# Patient Record
Sex: Female | Born: 1980 | Race: Black or African American | Hispanic: No | Marital: Single | Smoking: Never smoker
Health system: Southern US, Community
[De-identification: ages and names within clinical notes are randomized; demographics above are authoritative.]

## PROBLEM LIST (undated history)

## (undated) DIAGNOSIS — E669 Obesity, unspecified: Secondary | ICD-10-CM

## (undated) DIAGNOSIS — F32A Depression, unspecified: Secondary | ICD-10-CM

## (undated) DIAGNOSIS — R87629 Unspecified abnormal cytological findings in specimens from vagina: Secondary | ICD-10-CM

## (undated) DIAGNOSIS — F419 Anxiety disorder, unspecified: Secondary | ICD-10-CM

## (undated) HISTORY — DX: Anxiety disorder, unspecified: F41.9

## (undated) HISTORY — DX: Unspecified abnormal cytological findings in specimens from vagina: R87.629

## (undated) HISTORY — DX: Depression, unspecified: F32.A

## (undated) HISTORY — PX: NO PAST SURGERIES: SHX2092

---

## 2008-03-31 ENCOUNTER — Emergency Department (HOSPITAL_BASED_OUTPATIENT_CLINIC_OR_DEPARTMENT_OTHER): Admission: EM | Admit: 2008-03-31 | Discharge: 2008-03-31 | Payer: Self-pay | Admitting: Emergency Medicine

## 2008-06-30 ENCOUNTER — Emergency Department (HOSPITAL_BASED_OUTPATIENT_CLINIC_OR_DEPARTMENT_OTHER): Admission: EM | Admit: 2008-06-30 | Discharge: 2008-06-30 | Payer: Self-pay | Admitting: Emergency Medicine

## 2011-06-23 LAB — URINALYSIS, ROUTINE W REFLEX MICROSCOPIC
Leukocytes, UA: NEGATIVE
Nitrite: NEGATIVE
Protein, ur: NEGATIVE
Specific Gravity, Urine: 1.026
Urobilinogen, UA: 1

## 2011-06-23 LAB — URINE MICROSCOPIC-ADD ON

## 2011-06-28 LAB — DIFFERENTIAL
Eosinophils Relative: 1
Lymphocytes Relative: 19
Lymphs Abs: 1.5
Monocytes Absolute: 0.5

## 2011-06-28 LAB — BASIC METABOLIC PANEL
Chloride: 104
GFR calc non Af Amer: >60
Glucose, Bld: 101 — ABNORMAL HIGH
Potassium: 3.6
Sodium: 142

## 2011-06-28 LAB — CBC
HCT: 38.2
Hemoglobin: 12.9
WBC: 8

## 2011-06-28 LAB — PREGNANCY, URINE: Preg Test, Ur: NEGATIVE

## 2011-06-28 LAB — URINALYSIS, ROUTINE W REFLEX MICROSCOPIC
Bilirubin Urine: NEGATIVE
Glucose, UA: NEGATIVE
Nitrite: NEGATIVE
Specific Gravity, Urine: 1.016
pH: 7

## 2011-06-28 LAB — WET PREP, GENITAL

## 2016-01-22 ENCOUNTER — Emergency Department (HOSPITAL_BASED_OUTPATIENT_CLINIC_OR_DEPARTMENT_OTHER)
Admission: EM | Admit: 2016-01-22 | Discharge: 2016-01-22 | Disposition: A | Discharge status: Home / Self Care | Payer: Managed Care, Other (non HMO) | Attending: Emergency Medicine | Admitting: Emergency Medicine

## 2016-01-22 ENCOUNTER — Encounter (HOSPITAL_BASED_OUTPATIENT_CLINIC_OR_DEPARTMENT_OTHER): Payer: Self-pay | Admitting: Emergency Medicine

## 2016-01-22 DIAGNOSIS — E669 Obesity, unspecified: Secondary | ICD-10-CM | POA: Diagnosis not present

## 2016-01-22 DIAGNOSIS — J209 Acute bronchitis, unspecified: Secondary | ICD-10-CM

## 2016-01-22 DIAGNOSIS — R51 Headache: Secondary | ICD-10-CM | POA: Insufficient documentation

## 2016-01-22 DIAGNOSIS — R0981 Nasal congestion: Secondary | ICD-10-CM | POA: Insufficient documentation

## 2016-01-22 DIAGNOSIS — R05 Cough: Secondary | ICD-10-CM | POA: Diagnosis not present

## 2016-01-22 DIAGNOSIS — R531 Weakness: Secondary | ICD-10-CM | POA: Insufficient documentation

## 2016-01-22 HISTORY — DX: Obesity, unspecified: E66.9

## 2016-01-22 MED ORDER — BENZONATATE 100 MG PO CAPS
200.0000 mg | ORAL_CAPSULE | Freq: Once | ORAL | Status: AC
  Administered 2016-01-22: 200 mg via ORAL
  Filled 2016-01-22: qty 2

## 2016-01-22 MED ORDER — HYDROCOD POLST-CPM POLST ER 10-8 MG/5ML PO SUER: 5.0000 mL | Freq: Two times a day (BID) | ORAL | Status: DC | PRN

## 2016-01-22 MED ORDER — ALBUTEROL SULFATE HFA 108 (90 BASE) MCG/ACT IN AERS
2.0000 | INHALATION_SPRAY | RESPIRATORY_TRACT | Status: DC | PRN
  Administered 2016-01-22: 2 via RESPIRATORY_TRACT
  Filled 2016-01-22: qty 6.7

## 2016-01-22 NOTE — ED Notes (Signed)
MD at bedside. 

## 2016-01-22 NOTE — ED Provider Notes (Signed)
CSN: [id number]     Arrival date & time 01/22/16  0016 History   First MD Initiated Contact with Patient 01/22/16 0031     Chief Complaint  Patient presents with  . Flu-Like Symptoms      (Consider location/radiation/quality/duration/timing/severity/associated sxs/prior Treatment) HPI this is a 35 year old female with a three-week history of flulike symptoms. She initially had cold-like symptoms including nasal congestion, sinus pressure, ear pressure and throat discomfort. She has subsequently developed a nonproductive cough which is paroxysmal in nature and occurs every few minutes. She is having difficulty taking a deep breath because the stimulates coughing. She denies shortness of breath or wheezing. She is not having any fever. She states her body hurts "everywhere", and is worse with coughing. She rates her pain as a 9 out of 10. She has been taking over-the-counter cough and cold medications without adequate relief.   Past Medical History  Diagnosis Date  . Obesity    History reviewed. No pertinent past surgical history. No family history on file. Social History  Substance Use Topics  . Smoking status: Never Smoker   . Smokeless tobacco: None  . Alcohol Use: No   OB History    No data available     Review of Systems  All other systems reviewed and are negative.   Allergies  Review of patient's allergies indicates no known allergies.  Home Medications   Prior to Admission medications   Not on File   BP 116/86 mmHg  Pulse 83  Temp(Src) 98.5 F (36.9 C) (Oral)  Resp 16  Ht 5\' 5"  (1.651 m)  Wt 289 lb (131.09 kg)  BMI 48.09 kg/m2  SpO2 98%   Physical Exam  General: Well-developed, obese female in no acute distress; appearance consistent with age of record HENT: normocephalic; atraumatic; TMs normal; no tenderness to percussion over sinuses Eyes: pupils equal, round and reactive to light; extraocular muscles intact Neck: supple Heart: regular rate and  rhythm Lungs: Decreased air movement bilaterally; coughing on attempted deep breathing Abdomen: soft; obese; nontender; bowel sounds present Extremities: No deformity; full range of motion; pulses normal Neurologic: Awake, alert and oriented; motor function intact in all extremities and symmetric; no facial droop Skin: Warm and dry Psychiatric: Normal mood and affect    ED Course  Procedures (including critical care time)   MDM     Paula LibraJohn Natelie Ostrosky, MD 01/22/16 [phone]

## 2016-01-22 NOTE — ED Notes (Signed)
Cough, body aches, weakness, face pain x3 weeks.

## 2016-01-22 NOTE — Discharge Instructions (Signed)

## 2016-06-29 ENCOUNTER — Encounter (HOSPITAL_BASED_OUTPATIENT_CLINIC_OR_DEPARTMENT_OTHER): Payer: Self-pay | Admitting: *Deleted

## 2016-06-29 ENCOUNTER — Emergency Department (HOSPITAL_BASED_OUTPATIENT_CLINIC_OR_DEPARTMENT_OTHER)
Admission: EM | Admit: 2016-06-29 | Discharge: 2016-06-29 | Disposition: A | Discharge status: Home / Self Care | Payer: Managed Care, Other (non HMO) | Attending: Emergency Medicine | Admitting: Emergency Medicine

## 2016-06-29 DIAGNOSIS — Z792 Long term (current) use of antibiotics: Secondary | ICD-10-CM | POA: Insufficient documentation

## 2016-06-29 DIAGNOSIS — Z7952 Long term (current) use of systemic steroids: Secondary | ICD-10-CM | POA: Insufficient documentation

## 2016-06-29 DIAGNOSIS — R21 Rash and other nonspecific skin eruption: Secondary | ICD-10-CM | POA: Insufficient documentation

## 2016-06-29 DIAGNOSIS — L299 Pruritus, unspecified: Secondary | ICD-10-CM | POA: Diagnosis present

## 2016-06-29 MED ORDER — HYDROXYZINE HCL 25 MG PO TABS: 25.0000 mg | ORAL_TABLET | Freq: Four times a day (QID) | ORAL | 0 refills | Status: DC

## 2016-06-29 MED FILL — hydrOXYzine HCL 25 MG TABS: 25 | 5 days supply | Qty: 20 | Fill #0

## 2016-06-29 NOTE — ED Provider Notes (Signed)
[facility]   CSN: [id number] Arrival date & time: 06/29/16  4098     History   Chief Complaint Chief Complaint  Patient presents with  . Rash    HPI Destiny Lee is a 35 y.o. female.  HPI  Pt presenting with pruritic rash over trunk, back, arms.  She was seen one week ago at urgent care and given rx for steroid taper and doxycline- she states neither of these are helping her itching.  No lip or tongue involvement.  No difficulty breathing.  No fever/chills.  No new exposures.  Rash has not improved after being on steroids.  No household contacts with similar rash.  She did wash her clothes in bleach just prior to start of rash.  There are no other associated systemic symptoms, there are no other alleviating or modifying factors.   Past Medical History:  Diagnosis Date  . Obesity     There are no active problems to display for this patient.   History reviewed. No pertinent surgical history.  OB History    No data available       Home Medications    Prior to Admission medications   Medication Sig Start Date End Date Taking? Authorizing Provider  doxycycline (VIBRA-TABS) 100 MG tablet Take 100 mg by mouth 2 (two) times daily.   Yes Historical Provider, MD  predniSONE (DELTASONE) 10 MG tablet Take 20 mg by mouth daily with breakfast.    Yes Historical Provider, MD  hydrOXYzine (ATARAX/VISTARIL) 25 MG tablet Take 1 tablet (25 mg total) by mouth every 6 (six) hours. 06/29/16   Jerelyn Scott, MD    Family History No family history on file.  Social History Social History  Substance Use Topics  . Smoking status: Never Smoker  . Smokeless tobacco: Never Used  . Alcohol use No     Allergies   Review of patient's allergies indicates no known allergies.   Review of Systems Review of Systems  ROS reviewed and all otherwise negative except for mentioned in HPI   Physical Exam Updated Vital Signs BP 123/79 (BP Location: Left Arm)    Pulse 67   Temp 98.3 F (36.8 C) (Oral)   Resp 18   Ht 5\' 5"  (1.651 m)   Wt (!) 319 lb (144.7 kg)   SpO2 96%   BMI 53.08 kg/m  Vitals reviewed Physical Exam Physical Examination: General appearance - alert, well appearing, and in no distress Mental status - alert, oriented to person, place, and time Eyes - no conjunctival injection, no scleral icterus Mouth - mucous membranes moist, pharynx normal without lesions Chest - clear to auscultation, no wheezes, rales or rhonchi, symmetric air entry Heart - normal rate, regular rhythm, normal S1, S2, no murmurs, rubs, clicks or gallops Abdomen - soft, nontender, nondistended, no masses or organomegaly Neurological - alert, oriented, normal speech Extremities - peripheral pulses normal, no pedal edema, no clubbing or cyanosis Skin - normal coloration and turgor, scattered dry patches of raised flesh colored skin over back, stomach, chest, arms  ED Treatments / Results  Labs (all labs ordered are listed, but only abnormal results are displayed) Labs Reviewed - No data to display  EKG  EKG Interpretation None       Radiology No results found.  Procedures Procedures (including critical care time)  Medications Ordered in ED Medications - No data to display   Initial Impression / Assessment and Plan / ED Course  I have reviewed the triage  vital signs and the nursing notes.  Pertinent labs & imaging results that were available during my care of the patient were reviewed by me and considered in my medical decision making (see chart for details).  Clinical Course    Pt presenting with c/o rash.  She is currently on steroids and taking doxycyline without much relief.  Rash looks c/w contact dermatitis or pityriasis.  Doubt scabies.  Given rx for hydroxyzine, and referral to dermatology.    Patient is overall nontoxic and well hydrated in appearance.   No airway involvement.  Discharged with strict return precautions.  Pt agreeable  with plan.   Final Clinical Impressions(s) / ED Diagnoses   Final diagnoses:  Rash    New Prescriptions Discharge Medication List as of 06/29/2016  8:00 AM    START taking these medications   Details  hydrOXYzine (ATARAX/VISTARIL) 25 MG tablet Take 1 tablet (25 mg total) by mouth every 6 (six) hours., Starting Wed 06/29/2016, Print         Jerelyn ScottMartha Linker, MD 06/29/16 [phone]

## 2016-06-29 NOTE — Discharge Instructions (Signed)
Return to the ED with any concerns including lip or tongue swelling, difficulty breathing, vomiting and not able to keep down liquids, decreased level of alertness/lethargy, or any other alarming symptoms °

## 2016-06-29 NOTE — ED Triage Notes (Signed)
C/o upper body rash x 2 weeks with itching. [facility] and given doxycycline and prednisone without relief.

## 2016-07-11 DIAGNOSIS — R21 Rash and other nonspecific skin eruption: Secondary | ICD-10-CM | POA: Insufficient documentation

## 2016-07-15 ENCOUNTER — Ambulatory Visit: Payer: Self-pay | Admitting: Pediatrics

## 2018-11-27 ENCOUNTER — Ambulatory Visit (INDEPENDENT_AMBULATORY_CARE_PROVIDER_SITE_OTHER): Payer: 59 | Admitting: Family

## 2018-11-27 ENCOUNTER — Encounter: Payer: Self-pay | Admitting: Family

## 2018-11-27 VITALS — BP 124/67 | HR 85 | Temp 98.4°F | Resp 16

## 2018-11-27 DIAGNOSIS — R6889 Other general symptoms and signs: Secondary | ICD-10-CM | POA: Diagnosis not present

## 2018-11-27 DIAGNOSIS — H6691 Otitis media, unspecified, right ear: Secondary | ICD-10-CM

## 2018-11-27 DIAGNOSIS — T7840XA Allergy, unspecified, initial encounter: Secondary | ICD-10-CM | POA: Diagnosis not present

## 2018-11-27 MED ORDER — LORATADINE 10 MG PO TABS: 10.0000 mg | ORAL_TABLET | Freq: Every day | ORAL | 11 refills | Status: DC

## 2018-11-27 MED ORDER — AMOXICILLIN 500 MG PO CAPS: 500.0000 mg | ORAL_CAPSULE | Freq: Three times a day (TID) | ORAL | 0 refills | Status: DC

## 2018-11-27 NOTE — Progress Notes (Signed)
Subjective:    Patient ID: Destiny Lee, female    DOB: 10-18-1980, 38 y.o.   MRN: [id number]  HPI   Patient is a 38 yr old female who presents today with c/o cough, weakness. Symptoms started Sunday. Throat and ears are irritated.  Has some nasal congestion which is worse at night.   Reports + cold intolerance for some time. Reports that she keeps her apartment at 78 degrees.    Review of Systems See HPI  Past Medical History:  Diagnosis Date  . Obesity      Social History   Socioeconomic History  . Marital status: Single    Spouse name: Not on file  . Number of children: Not on file  . Years of education: Not on file  . Highest education level: Not on file  Occupational History  . Not on file  Social Needs  . Financial resource strain: Not on file  . Food insecurity:    Worry: Not on file    Inability: Not on file  . Transportation needs:    Medical: Not on file    Non-medical: Not on file  Tobacco Use  . Smoking status: Never Smoker  . Smokeless tobacco: Never Used  Substance and Sexual Activity  . Alcohol use: No  . Drug use: No  . Sexual activity: Yes    Birth control/protection: I.U.D.  Lifestyle  . Physical activity:    Days per week: Not on file    Minutes per session: Not on file  . Stress: Not on file  Relationships  . Social connections:    Talks on phone: Not on file    Gets together: Not on file    Attends religious service: Not on file    Active member of club or organization: Not on file    Attends meetings of clubs or organizations: Not on file    Relationship status: Not on file  . Intimate partner violence:    Fear of current or ex partner: Not on file    Emotionally abused: Not on file    Physically abused: Not on file    Forced sexual activity: Not on file  Other Topics Concern  . Not on file  Social History Narrative  . Not on file    No past surgical history on file.  No family history on file.  No Known  Allergies  No current outpatient medications on file prior to visit.   No current facility-administered medications on file prior to visit.     BP 124/67 (BP Location: Left Arm, Patient Position: Sitting, Cuff Size: Large)   Pulse 85   Temp 98.4 F (36.9 C) (Oral)   Resp 16   SpO2 98%       Objective:   Physical Exam Constitutional:      Appearance: She is well-developed.  HENT:     Right Ear: Ear canal normal. Tympanic membrane is erythematous and retracted. Tympanic membrane is not bulging.     Left Ear: Ear canal normal. Tympanic membrane is retracted.  Neck:     Musculoskeletal: Neck supple.     Thyroid: No thyromegaly.  Cardiovascular:     Rate and Rhythm: Normal rate and regular rhythm.     Heart sounds: Normal heart sounds. No murmur.  Pulmonary:     Effort: Pulmonary effort is normal. No respiratory distress.     Breath sounds: Normal breath sounds. No wheezing.  Skin:    General: Skin is warm and  dry.  Neurological:     Mental Status: She is alert and oriented to person, place, and time.  Psychiatric:        Behavior: Behavior normal.        Thought Content: Thought content normal.        Judgment: Judgment normal.           Assessment & Plan:  Right otitis media- new. Will rx with amoxicillin.  Cold intolerance- will check TSH and CBC.  Allergies- trial of claritin.

## 2018-11-27 NOTE — Patient Instructions (Signed)
Please begin amoxicillin for right sided ear infection. Add claritin once daily for allergy symptoms. Call if symptoms worsen or if not improved in 3 days.

## 2018-11-28 LAB — CBC WITH DIFFERENTIAL/PLATELET
Basophils Absolute: 0 10*3/uL (ref 0.0–0.1)
Basophils Relative: 0.6 % (ref 0.0–3.0)
EOS PCT: 3 % (ref 0.0–5.0)
Eosinophils Absolute: 0.2 10*3/uL (ref 0.0–0.7)
HCT: 39.9 % (ref 36.0–46.0)
Hemoglobin: 13.4 g/dL (ref 12.0–15.0)
LYMPHS ABS: 1.5 10*3/uL (ref 0.7–4.0)
Lymphocytes Relative: 19 % (ref 12.0–46.0)
MCHC: 33.4 g/dL (ref 30.0–36.0)
MCV: 93.9 fl (ref 78.0–100.0)
MONO ABS: 0.8 10*3/uL (ref 0.1–1.0)
MONOS PCT: 9.3 % (ref 3.0–12.0)
NEUTROS ABS: 5.5 10*3/uL (ref 1.4–7.7)
NEUTROS PCT: 68.1 % (ref 43.0–77.0)
PLATELETS: 180 10*3/uL (ref 150.0–400.0)
RBC: 4.25 Mil/uL (ref 3.87–5.11)
RDW: 13.5 % (ref 11.5–15.5)
WBC: 8.1 10*3/uL (ref 4.0–10.5)

## 2018-11-28 LAB — TSH: TSH: 3.08 u[IU]/mL (ref 0.35–4.50)

## 2018-11-28 MED FILL — AMOXICILLIN 500 MG CAPSULE: 500 | 10 days supply | Qty: 30 | Fill #0

## 2018-11-28 MED FILL — LORATADINE 10 MG TABLET: 10 | 100 days supply | Qty: 100 | Fill #0

## 2019-04-05 ENCOUNTER — Other Ambulatory Visit: Payer: Self-pay

## 2019-04-05 ENCOUNTER — Encounter: Payer: Self-pay | Admitting: Family Medicine

## 2019-04-05 ENCOUNTER — Ambulatory Visit (INDEPENDENT_AMBULATORY_CARE_PROVIDER_SITE_OTHER): Payer: 59 | Admitting: Family Medicine

## 2019-04-05 VITALS — BP 120/82 | HR 74 | Temp 97.9°F | Ht 65.0 in | Wt 311.2 lb

## 2019-04-05 DIAGNOSIS — M79672 Pain in left foot: Secondary | ICD-10-CM | POA: Diagnosis not present

## 2019-04-05 DIAGNOSIS — Z0000 Encounter for general adult medical examination without abnormal findings: Secondary | ICD-10-CM

## 2019-04-05 NOTE — Progress Notes (Signed)
Chief Complaint  Patient presents with  . New Patient (Initial Visit)    problems with legs     Destiny Lee is here for a complete physical.   Her last physical was >1 year ago.  Current diet: in general, a "healthy" diet. Current exercise: walking, cycling. Contraception? Yes Is losing some weight, intentionally. No LMP recorded. (Menstrual status: IUD). mirena Seatbelt? Yes  Health Maintenance Pap/HPV- Yes- 2 years ago Tetanus- Yes HIV screening- Yes - 2 years ago  Past Medical History:  Diagnosis Date  . Obesity      Past Surgical History:  Procedure Laterality Date  . NO PAST SURGERIES      Medications  Takes no meds routinely.  Allergies No Known Allergies  Review of Systems: Constitutional:  no unexpected weight changes Eye: +intermittent blurred vision Ear/Nose/Mouth/Throat:  Ears:  no tinnitus or vertigo and no recent change in hearing Nose/Mouth/Throat:  no complaints of nasal congestion, no sore throat Cardiovascular: no chest pain Respiratory:  no cough and no shortness of breath Gastrointestinal:  no abdominal pain, no change in bowel habits GU:  Female: negative for dysuria or pelvic pain Musculoskeletal/Extremities: +knee pain, +L foot pain, otherwise no pain of the joints Integumentary (Skin/Breast):  no abnormal skin lesions reported Neurologic:  no headaches Endocrine:  denies fatigue Hematologic/Lymphatic:  No areas of easy bleeding  Exam BP 120/82 (BP Location: Left Arm, Patient Position: Sitting, Cuff Size: Large)   Pulse 74   Temp 97.9 F (36.6 C) (Oral)   Ht 5\' 5"  (1.651 m)   Wt (!) 311 lb 3 oz (141.2 kg)   SpO2 98%   BMI 51.78 kg/m  General:  Destiny developed, Destiny nourished, in no apparent distress Skin:  no significant moles, warts, or growths Head:  no masses, lesions, or tenderness Eyes:  pupils equal and round, sclera anicteric without injection Ears:  canals without lesions, TMs shiny without retraction, no  obvious effusion, no erythema Nose:  nares patent, septum midline, mucosa normal, and no drainage or sinus tenderness Throat/Pharynx:  lips and gingiva without lesion; tongue and uvula midline; non-inflamed pharynx; no exudates or postnasal drainage Neck: neck supple without adenopathy, thyromegaly, or masses Lungs:  clear to auscultation, breath sounds equal bilaterally, no respiratory distress Cardio:  regular rate and rhythm, no bruits, no LE edema Abdomen:  abdomen soft, nontender; bowel sounds normal; no masses or organomegaly Genital: Defer to GYN Musculoskeletal:  symmetrical muscle groups noted without atrophy or deformity Extremities:  no clubbing, cyanosis, or edema, no deformities, no skin discoloration Neuro:  gait normal; deep tendon reflexes normal and symmetric Psych: Destiny oriented with normal range of affect and appropriate judgment/insight  Assessment and Plan  Destiny adult exam - Plan: CBC, Comprehensive metabolic panel, Lipid panel  Left foot pain - Plan: Ambulatory referral to Podiatry   Destiny 38 y.o. female. Counseled on diet and exercise. Other orders as above. Follow up in 1 yr or prn. The patient voiced understanding and agreement to the plan.  [place], DO 04/05/19 8:01 AM

## 2019-04-05 NOTE — Patient Instructions (Signed)
Give [place] 2-3 business days to get the results of your labs back.   Keep the diet clean and stay active.  If you do not hear anything about your referral in the next 1-2 weeks, call our office and ask for an update.  PowerStep is an OTC insert that offers good support.   Healthy Eating Plan Many factors influence your heart health, including eating and exercise habits. Heart (coronary) risk increases with abnormal blood fat (lipid) levels. Heart-healthy meal planning includes limiting unhealthy fats, increasing healthy fats, and making other small dietary changes. This includes maintaining a healthy body weight to help keep lipid levels within a normal range.  WHAT IS MY PLAN?  Your health care provider recommends that you:  Drink a glass of water before meals to help with satiety.  Eat slowly.  An alternative to the water is to add Metamucil. This will help with satiety as well. It does contain calories, unlike water.  WHAT TYPES OF FAT SHOULD I CHOOSE?  Choose healthy fats more often. Choose monounsaturated and polyunsaturated fats, such as olive oil and canola oil, flaxseeds, walnuts, almonds, and seeds.  Eat more omega-3 fats. Good choices include salmon, mackerel, sardines, tuna, flaxseed oil, and ground flaxseeds. Aim to eat fish at least two times each week.  Avoid foods with partially hydrogenated oils in them. These contain trans fats. Examples of foods that contain trans fats are stick margarine, some tub margarines, cookies, crackers, and other baked goods. If you are going to avoid a fat, this is the one to avoid!  WHAT GENERAL GUIDELINES DO I NEED TO FOLLOW?  Check food labels carefully to identify foods with trans fats. Avoid these types of options when possible.  Fill one half of your plate with vegetables and green salads. Eat 4-5 servings of vegetables per day. A serving of vegetables equals 1 cup of raw leafy vegetables,  cup of raw or cooked cut-up vegetables, or   cup of vegetable juice.  Fill one fourth of your plate with whole grains. Look for the word "whole" as the first word in the ingredient list.  Fill one fourth of your plate with lean protein foods.  Eat 4-5 servings of fruit per day. A serving of fruit equals one medium whole fruit,  cup of dried fruit,  cup of fresh, frozen, or canned fruit. Try to avoid fruits in cups/syrups as the sugar content can be high.  Eat more foods that contain soluble fiber. Examples of foods that contain this type of fiber are apples, broccoli, carrots, beans, peas, and barley. Aim to get 20-30 g of fiber per day.  Eat more home-cooked food and less restaurant, buffet, and fast food.  Limit or avoid alcohol.  Limit foods that are high in starch and sugar.  Avoid fried foods when able.  Cook foods by using methods other than frying. Baking, boiling, grilling, and broiling are all great options. Other fat-reducing suggestions include: ? Removing the skin from poultry. ? Removing all visible fats from meats. ? Skimming the fat off of stews, soups, and gravies before serving them. ? Steaming vegetables in water or broth.  Lose weight if you are overweight. Losing just 5-10% of your initial body weight can help your overall health and prevent diseases such as diabetes and heart disease.  Increase your consumption of nuts, legumes, and seeds to 4-5 servings per week. One serving of dried beans or legumes equals  cup after being cooked, one serving of nuts equals  1 ounces, and one serving of seeds equals  ounce or 1 tablespoon.  WHAT ARE GOOD FOODS CAN I EAT? Grains Grainy breads (try to find bread that is 3 g of fiber per slice or greater), oatmeal, light popcorn. Whole-grain cereals. Rice and pasta, including brown rice and those that are made with whole wheat. Edamame pasta is a great alternative to grain pasta. It has a higher protein content. Try to avoid significant consumption of white bread, sugary  cereals, or pastries/baked goods.  Vegetables All vegetables. Cooked white potatoes do not count as vegetables.  Fruits All fruits, but limit pineapple and bananas as these fruits have a higher sugar content.  Meats and Other Protein Sources Lean, well-trimmed beef, veal, pork, and lamb. Chicken and [place] without skin. All fish and shellfish. Wild duck, rabbit, pheasant, and venison. Egg whites or low-cholesterol egg substitutes. Dried beans, peas, lentils, and tofu.Seeds and most nuts.  Dairy Low-fat or nonfat cheeses, including ricotta, string, and mozzarella. Skim or 1% milk that is liquid, powdered, or evaporated. Buttermilk that is made with low-fat milk. Nonfat or low-fat yogurt. Soy/Almond milk are good alternatives if you cannot handle dairy.  Beverages Water is the best for you. Sports drinks with less sugar are more desirable unless you are a highly active athlete.  Sweets and Desserts Sherbets and fruit ices. Honey, jam, marmalade, jelly, and syrups. Dark chocolate.  Eat all sweets and desserts in moderation.  Fats and Oils Nonhydrogenated (trans-free) margarines. Vegetable oils, including soybean, sesame, sunflower, olive, peanut, safflower, corn, canola, and cottonseed. Salad dressings or mayonnaise that are made with a vegetable oil. Limit added fats and oils that you use for cooking, baking, salads, and as spreads.  Other Cocoa powder. Coffee and tea. Most condiments.  The items listed above may not be a complete list of recommended foods or beverages. Contact your dietitian for more options.   EXERCISES  RANGE OF MOTION (ROM) AND STRETCHING EXERCISES - Low Back Pain Most people with lower back pain will find that their symptoms get worse with excessive bending forward (flexion) or arching at the lower back (extension). The exercises that will help resolve your symptoms will focus on the opposite motion.  If you have pain, numbness or tingling which travels down into  your buttocks, leg or foot, the goal of the therapy is for these symptoms to move closer to your back and eventually resolve. Sometimes, these leg symptoms will get better, but your lower back pain may worsen. This is often an indication of progress in your rehabilitation. Be very alert to any changes in your symptoms and the activities in which you participated in the 24 hours prior to the change. Sharing this information with your caregiver will allow him or her to most efficiently treat your condition. These exercises may help you when beginning to rehabilitate your injury. Your symptoms may resolve with or without further involvement from your physician, physical therapist or athletic trainer. While completing these exercises, remember:   Restoring tissue flexibility helps normal motion to return to the joints. This allows healthier, less painful movement and activity.  An effective stretch should be held for at least 30 seconds.  A stretch should never be painful. You should only feel a gentle lengthening or release in the stretched tissue. FLEXION RANGE OF MOTION AND STRETCHING EXERCISES:  STRETCH - Flexion, Single Knee to Chest   Lie on a firm bed or floor with both legs extended in front of you.  Keeping one  leg in contact with the floor, bring your opposite knee to your chest. Hold your leg in place by either grabbing behind your thigh or at your knee.  Pull until you feel a gentle stretch in your low back. Hold 30 seconds.  Slowly release your grasp and repeat the exercise with the opposite side. Repeat 2 times. Complete this exercise 3 times per week.   STRETCH - Flexion, Double Knee to Chest  Lie on a firm bed or floor with both legs extended in front of you.  Keeping one leg in contact with the floor, bring your opposite knee to your chest.  Tense your stomach muscles to support your back and then lift your other knee to your chest. Hold your legs in place by either grabbing  behind your thighs or at your knees.  Pull both knees toward your chest until you feel a gentle stretch in your low back. Hold 30 seconds.  Tense your stomach muscles and slowly return one leg at a time to the floor. Repeat 2 times. Complete this exercise 3 times per week.   STRETCH - Low Trunk Rotation  Lie on a firm bed or floor. Keeping your legs in front of you, bend your knees so they are both pointed toward the ceiling and your feet are flat on the floor.  Extend your arms out to the side. This will stabilize your upper body by keeping your shoulders in contact with the floor.  Gently and slowly drop both knees together to one side until you feel a gentle stretch in your low back. Hold for 30 seconds.  Tense your stomach muscles to support your lower back as you bring your knees back to the starting position. Repeat the exercise to the other side. Repeat 2 times. Complete this exercise at least 3 times per week.   EXTENSION RANGE OF MOTION AND FLEXIBILITY EXERCISES:  STRETCH - Extension, Prone on Elbows   Lie on your stomach on the floor, a bed will be too soft. Place your palms about shoulder width apart and at the height of your head.  Place your elbows under your shoulders. If this is too painful, stack pillows under your chest.  Allow your body to relax so that your hips drop lower and make contact more completely with the floor.  Hold this position for 30 seconds.  Slowly return to lying flat on the floor. Repeat 2 times. Complete this exercise 3 times per week.   RANGE OF MOTION - Extension, Prone Press Ups  Lie on your stomach on the floor, a bed will be too soft. Place your palms about shoulder width apart and at the height of your head.  Keeping your back as relaxed as possible, slowly straighten your elbows while keeping your hips on the floor. You may adjust the placement of your hands to maximize your comfort. As you gain motion, your hands will come more  underneath your shoulders.  Hold this position 30 seconds.  Slowly return to lying flat on the floor. Repeat 2 times. Complete this exercise 3 times per week.   RANGE OF MOTION- Quadruped, Neutral Spine   Assume a hands and knees position on a firm surface. Keep your hands under your shoulders and your knees under your hips. You may place padding under your knees for comfort.  Drop your head and point your tailbone toward the ground below you. This will round out your lower back like an angry cat. Hold this position for 30  seconds.  Slowly lift your head and release your tail bone so that your back sags into a large arch, like an old horse.  Hold this position for 30 seconds.  Repeat this until you feel limber in your low back.  Now, find your "sweet spot." This will be the most comfortable position somewhere between the two previous positions. This is your neutral spine. Once you have found this position, tense your stomach muscles to support your low back.  Hold this position for 30 seconds. Repeat 2 times. Complete this exercise 3 times per week.   STRENGTHENING EXERCISES - Low Back Sprain These exercises may help you when beginning to rehabilitate your injury. These exercises should be done near your "sweet spot." This is the neutral, low-back arch, somewhere between fully rounded and fully arched, that is your least painful position. When performed in this safe range of motion, these exercises can be used for people who have either a flexion or extension based injury. These exercises may resolve your symptoms with or without further involvement from your physician, physical therapist or athletic trainer. While completing these exercises, remember:   Muscles can gain both the endurance and the strength needed for everyday activities through controlled exercises.  Complete these exercises as instructed by your physician, physical therapist or athletic trainer. Increase the resistance  and repetitions only as guided.  You may experience muscle soreness or fatigue, but the pain or discomfort you are trying to eliminate should never worsen during these exercises. If this pain does worsen, stop and make certain you are following the directions exactly. If the pain is still present after adjustments, discontinue the exercise until you can discuss the trouble with your caregiver.  STRENGTHENING - Deep Abdominals, Pelvic Tilt   Lie on a firm bed or floor. Keeping your legs in front of you, bend your knees so they are both pointed toward the ceiling and your feet are flat on the floor.  Tense your lower abdominal muscles to press your low back into the floor. This motion will rotate your pelvis so that your tail bone is scooping upwards rather than pointing at your feet or into the floor. With a gentle tension and even breathing, hold this position for 3 seconds. Repeat 2 times. Complete this exercise 3 times per week.   STRENGTHENING - Abdominals, Crunches   Lie on a firm bed or floor. Keeping your legs in front of you, bend your knees so they are both pointed toward the ceiling and your feet are flat on the floor. Cross your arms over your chest.  Slightly tip your chin down without bending your neck.  Tense your abdominals and slowly lift your trunk high enough to just clear your shoulder blades. Lifting higher can put excessive stress on the lower back and does not further strengthen your abdominal muscles.  Control your return to the starting position. Repeat 2 times. Complete this exercise 3 times per week.   STRENGTHENING - Quadruped, Opposite UE/LE Lift   Assume a hands and knees position on a firm surface. Keep your hands under your shoulders and your knees under your hips. You may place padding under your knees for comfort.  Find your neutral spine and gently tense your abdominal muscles so that you can maintain this position. Your shoulders and hips should form a  rectangle that is parallel with the floor and is not twisted.  Keeping your trunk steady, lift your right hand no higher than your shoulder and then  your left leg no higher than your hip. Make sure you are not holding your breath. Hold this position for 30 seconds.  Continuing to keep your abdominal muscles tense and your back steady, slowly return to your starting position. Repeat with the opposite arm and leg. Repeat 2 times. Complete this exercise 3 times per week.   STRENGTHENING - Abdominals and Quadriceps, Straight Leg Raise   Lie on a firm bed or floor with both legs extended in front of you.  Keeping one leg in contact with the floor, bend the other knee so that your foot can rest flat on the floor.  Find your neutral spine, and tense your abdominal muscles to maintain your spinal position throughout the exercise.  Slowly lift your straight leg off the floor about 6 inches for a count of 3, making sure to not hold your breath.  Still keeping your neutral spine, slowly lower your leg all the way to the floor. Repeat this exercise with each leg 2 times. Complete this exercise 3 times per week.  POSTURE AND BODY MECHANICS CONSIDERATIONS - Low Back Sprain Keeping correct posture when sitting, standing or completing your activities will reduce the stress put on different body tissues, allowing injured tissues a chance to heal and limiting painful experiences. The following are general guidelines for improved posture.  While reading these guidelines, remember:  The exercises prescribed by your provider will help you have the flexibility and strength to maintain correct postures.  The correct posture provides the best environment for your joints to work. All of your joints have less wear and tear when properly supported by a spine with good posture. This means you will experience a healthier, less painful body.  Correct posture must be practiced with all of your activities, especially  prolonged sitting and standing. Correct posture is as important when doing repetitive low-stress activities (typing) as it is when doing a single heavy-load activity (lifting).  RESTING POSITIONS Consider which positions are most painful for you when choosing a resting position. If you have pain with flexion-based activities (sitting, bending, stooping, squatting), choose a position that allows you to rest in a less flexed posture. You would want to avoid curling into a fetal position on your side. If your pain worsens with extension-based activities (prolonged standing, working overhead), avoid resting in an extended position such as sleeping on your stomach. Most people will find more comfort when they rest with their spine in a more neutral position, neither too rounded nor too arched. Lying on a non-sagging bed on your side with a pillow between your knees, or on your back with a pillow under your knees will often provide some relief. Keep in mind, being in any one position for a prolonged period of time, no matter how correct your posture, can still lead to stiffness.  PROPER SITTING POSTURE In order to minimize stress and discomfort on your spine, you must sit with correct posture. Sitting with good posture should be effortless for a healthy body. Returning to good posture is a gradual process. Many people can work toward this most comfortably by using various supports until they have the flexibility and strength to maintain this posture on their own. When sitting with proper posture, your ears will fall over your shoulders and your shoulders will fall over your hips. You should use the back of the chair to support your upper back. Your lower back will be in a neutral position, just slightly arched. You may place a small  pillow or folded towel at the base of your lower back for  support.  When working at a desk, create an environment that supports good, upright posture. Without extra support, muscles  tire, which leads to excessive strain on joints and other tissues. Keep these recommendations in mind:  CHAIR:  A chair should be able to slide under your desk when your back makes contact with the back of the chair. This allows you to work closely.  The chair's height should allow your eyes to be level with the upper part of your monitor and your hands to be slightly lower than your elbows.  BODY POSITION  Your feet should make contact with the floor. If this is not possible, use a foot rest.  Keep your ears over your shoulders. This will reduce stress on your neck and low back.  INCORRECT SITTING POSTURES  If you are feeling tired and unable to assume a healthy sitting posture, do not slouch or slump. This puts excessive strain on your back tissues, causing more damage and pain. Healthier options include:  Using more support, like a lumbar pillow.  Switching tasks to something that requires you to be upright or walking.  Talking a brief walk.  Lying down to rest in a neutral-spine position.  PROLONGED STANDING WHILE SLIGHTLY LEANING FORWARD  When completing a task that requires you to lean forward while standing in one place for a long time, place either foot up on a stationary 2-4 inch high object to help maintain the best posture. When both feet are on the ground, the lower back tends to lose its slight inward curve. If this curve flattens (or becomes too large), then the back and your other joints will experience too much stress, tire more quickly, and can cause pain.  CORRECT STANDING POSTURES Proper standing posture should be assumed with all daily activities, even if they only take a few moments, like when brushing your teeth. As in sitting, your ears should fall over your shoulders and your shoulders should fall over your hips. You should keep a slight tension in your abdominal muscles to brace your spine. Your tailbone should point down to the ground, not behind your body,  resulting in an over-extended swayback posture.   INCORRECT STANDING POSTURES  Common incorrect standing postures include a forward head, locked knees and/or an excessive swayback. WALKING Walk with an upright posture. Your ears, shoulders and hips should all line-up.  PROLONGED ACTIVITY IN A FLEXED POSITION When completing a task that requires you to bend forward at your waist or lean over a low surface, try to find a way to stabilize 3 out of 4 of your limbs. You can place a hand or elbow on your thigh or rest a knee on the surface you are reaching across. This will provide you more stability, so that your muscles do not tire as quickly. By keeping your knees relaxed, or slightly bent, you will also reduce stress across your lower back. CORRECT LIFTING TECHNIQUES  DO :  Assume a wide stance. This will provide you more stability and the opportunity to get as close as possible to the object which you are lifting.  Tense your abdominals to brace your spine. Bend at the knees and hips. Keeping your back locked in a neutral-spine position, lift using your leg muscles. Lift with your legs, keeping your back straight.  Test the weight of unknown objects before attempting to lift them.  Try to keep your elbows locked down  at your sides in order get the best strength from your shoulders when carrying an object.     Always ask for help when lifting heavy or awkward objects. INCORRECT LIFTING TECHNIQUES DO NOT:   Lock your knees when lifting, even if it is a small object.  Bend and twist. Pivot at your feet or move your feet when needing to change directions.  Assume that you can safely pick up even a paperclip without proper posture.

## 2019-04-05 NOTE — Addendum Note (Signed)
Addended by: Caffie Pinto on: 04/05/2019 01:38 PM   Modules accepted: Orders

## 2019-04-08 LAB — CBC
HCT: 40.9 % (ref 36.0–46.0)
Hemoglobin: 13.4 g/dL (ref 12.0–15.0)
MCHC: 32.7 g/dL (ref 30.0–36.0)
MCV: 96 fl (ref 78.0–100.0)
Platelets: 223 10*3/uL (ref 150.0–400.0)
RBC: 4.26 Mil/uL (ref 3.87–5.11)
RDW: 13.7 % (ref 11.5–15.5)
WBC: 5.3 10*3/uL (ref 4.0–10.5)

## 2019-04-08 LAB — COMPREHENSIVE METABOLIC PANEL
ALT: 15 U/L (ref 0–35)
AST: 12 U/L (ref 0–37)
Albumin: 4.3 g/dL (ref 3.5–5.2)
Alkaline Phosphatase: 41 U/L (ref 39–117)
BUN: 12 mg/dL (ref 6–23)
CO2: 28 mEq/L (ref 19–32)
Calcium: 9 mg/dL (ref 8.4–10.5)
Chloride: 103 mEq/L (ref 96–112)
Creatinine, Ser: 0.72 mg/dL (ref 0.40–1.20)
GFR: 109.25 mL/min (ref >60.00)
Glucose, Bld: 78 mg/dL (ref 70–99)
Potassium: 4 mEq/L (ref 3.5–5.1)
Sodium: 138 mEq/L (ref 135–145)
Total Bilirubin: 0.4 mg/dL (ref 0.2–1.2)
Total Protein: 7.3 g/dL (ref 6.0–8.3)

## 2019-04-08 LAB — LIPID PANEL
Cholesterol: 197 mg/dL (ref 0–200)
HDL: 45.5 mg/dL (ref >39.00)
LDL Cholesterol: 140 mg/dL — ABNORMAL HIGH (ref 0–99)
NonHDL: 151.19
Total CHOL/HDL Ratio: 4
Triglycerides: 57 mg/dL (ref 0.0–149.0)
VLDL: 11.4 mg/dL (ref 0.0–40.0)

## 2019-04-08 NOTE — Addendum Note (Signed)
Addended by: Caffie Pinto on: 04/08/2019 01:29 PM   Modules accepted: Orders

## 2019-04-19 ENCOUNTER — Ambulatory Visit: Payer: 59 | Admitting: Podiatry

## 2019-04-19 ENCOUNTER — Ambulatory Visit (INDEPENDENT_AMBULATORY_CARE_PROVIDER_SITE_OTHER): Payer: 59

## 2019-04-19 ENCOUNTER — Other Ambulatory Visit: Payer: Self-pay

## 2019-04-19 VITALS — Temp 98.3°F

## 2019-04-19 DIAGNOSIS — M19079 Primary osteoarthritis, unspecified ankle and foot: Secondary | ICD-10-CM

## 2019-04-19 DIAGNOSIS — G5762 Lesion of plantar nerve, left lower limb: Secondary | ICD-10-CM

## 2019-04-19 DIAGNOSIS — M19072 Primary osteoarthritis, left ankle and foot: Secondary | ICD-10-CM | POA: Diagnosis not present

## 2019-04-19 DIAGNOSIS — M79671 Pain in right foot: Secondary | ICD-10-CM

## 2019-04-19 DIAGNOSIS — M79672 Pain in left foot: Secondary | ICD-10-CM

## 2019-04-19 DIAGNOSIS — G5763 Lesion of plantar nerve, bilateral lower limbs: Secondary | ICD-10-CM

## 2019-04-19 DIAGNOSIS — G5761 Lesion of plantar nerve, right lower limb: Secondary | ICD-10-CM | POA: Diagnosis not present

## 2019-04-19 DIAGNOSIS — M19071 Primary osteoarthritis, right ankle and foot: Secondary | ICD-10-CM

## 2019-04-19 NOTE — Progress Notes (Signed)
  Subjective:  Patient ID: Destiny Lee, female    DOB: [birth year],  MRN: [id number]  Chief Complaint  Patient presents with  . Foot Pain    Pt states bilateral "all over" foot pain since november of 2019. Pt states no known injury and that it is worst in the morning. Pt states dorsal/lateral aspect is worst.    38 y.o. female presents with the above complaint. Hx as above.  Review of Systems: Negative except as noted in the HPI. Denies N/V/F/Ch.  Past Medical History:  Diagnosis Date  . Obesity    No current outpatient medications on file.  Social History   Tobacco Use  Smoking Status Never Smoker  Smokeless Tobacco Never Used    No Known Allergies Objective:   Vitals:   04/19/19 1357  Temp: 98.3 F (36.8 C)   There is no height or weight on file to calculate BMI. Constitutional Well developed. Well nourished.  Vascular Dorsalis pedis pulses palpable bilaterally. Posterior tibial pulses palpable bilaterally. Capillary refill normal to all digits.  No cyanosis or clubbing noted. Pedal hair growth normal.  Neurologic Normal speech. Oriented to person, place, and time. Epicritic sensation to light touch grossly present bilaterally.  Dermatologic Nails well groomed and normal in appearance. No open wounds. No skin lesions.  Orthopedic: POP 3rd IS bilat with mulder's click. POP dorsal 2nd TMT   Radiographs: taken and reviewed midfoot DJD otherwise no acute fractures or dislocations. Assessment:   1. Arthritis of midfoot   2. Morton's neuroma of both feet    Plan:  Patient was evaluated and treated and all questions answered.  Interdigital Neuroma, bilaterally -Educated on etiology -Interspace injection delivered as below. -Educated on padding and proper shoegear -Would benefit from [place]  Procedure: Neuroma Injection Location: Bilateral 3rd interspace Skin Prep: Alcohol. Injectate: 0.5 cc 0.5% marcaine plain, 0.5 cc dexamethasone  phosphate. Disposition: Patient tolerated procedure well. Injection site dressed with a band-aid.  Midfoot arthritis bilat -Injection as below  Procedure: Joint Injection Location: Bilateral dorsal 2nd TMT Skin Prep: Alcohol. Injectate: 0.5 cc 1% lidocaine plain, 0.5 cc dexamethasone phosphate. Disposition: Patient tolerated procedure well. Injection site dressed with a band-aid.  Return in about 3 weeks (around 05/10/2019) for Neuroma, Arthritis, Bilateral.

## 2019-04-22 ENCOUNTER — Other Ambulatory Visit: Payer: Self-pay | Admitting: Podiatry

## 2019-04-22 DIAGNOSIS — G5761 Lesion of plantar nerve, right lower limb: Secondary | ICD-10-CM

## 2019-04-22 DIAGNOSIS — G5762 Lesion of plantar nerve, left lower limb: Secondary | ICD-10-CM

## 2019-04-22 DIAGNOSIS — M19079 Primary osteoarthritis, unspecified ankle and foot: Secondary | ICD-10-CM

## 2019-05-09 ENCOUNTER — Telehealth: Payer: Self-pay | Admitting: Podiatry

## 2019-05-09 NOTE — Telephone Encounter (Signed)
Pt wanted to know if orthotics have come in yet.

## 2019-05-10 ENCOUNTER — Ambulatory Visit: Payer: 59 | Admitting: Podiatry

## 2019-05-12 ENCOUNTER — Encounter (HOSPITAL_BASED_OUTPATIENT_CLINIC_OR_DEPARTMENT_OTHER): Payer: Self-pay | Admitting: *Deleted

## 2019-05-12 ENCOUNTER — Other Ambulatory Visit: Payer: Self-pay

## 2019-05-12 ENCOUNTER — Emergency Department (HOSPITAL_BASED_OUTPATIENT_CLINIC_OR_DEPARTMENT_OTHER): Payer: 59

## 2019-05-12 ENCOUNTER — Emergency Department (HOSPITAL_BASED_OUTPATIENT_CLINIC_OR_DEPARTMENT_OTHER)
Admission: EM | Admit: 2019-05-12 | Discharge: 2019-05-12 | Disposition: A | Discharge status: Home / Self Care | Payer: 59 | Attending: Emergency Medicine | Admitting: Emergency Medicine

## 2019-05-12 DIAGNOSIS — R109 Unspecified abdominal pain: Secondary | ICD-10-CM | POA: Diagnosis not present

## 2019-05-12 DIAGNOSIS — A599 Trichomoniasis, unspecified: Secondary | ICD-10-CM | POA: Diagnosis not present

## 2019-05-12 DIAGNOSIS — N72 Inflammatory disease of cervix uteri: Secondary | ICD-10-CM | POA: Diagnosis not present

## 2019-05-12 DIAGNOSIS — K429 Umbilical hernia without obstruction or gangrene: Secondary | ICD-10-CM | POA: Diagnosis not present

## 2019-05-12 DIAGNOSIS — R1030 Lower abdominal pain, unspecified: Secondary | ICD-10-CM | POA: Diagnosis present

## 2019-05-12 LAB — URINALYSIS, ROUTINE W REFLEX MICROSCOPIC
Bilirubin Urine: NEGATIVE
Glucose, UA: NEGATIVE mg/dL
Ketones, ur: NEGATIVE mg/dL
Leukocytes,Ua: NEGATIVE
Nitrite: NEGATIVE
Protein, ur: NEGATIVE mg/dL
Specific Gravity, Urine: 1.02 (ref 1.005–1.030)
pH: 5.5 (ref 5.0–8.0)

## 2019-05-12 LAB — CBC WITH DIFFERENTIAL/PLATELET
Abs Immature Granulocytes: 0.03 10*3/uL (ref 0.00–0.07)
Basophils Absolute: 0 10*3/uL (ref 0.0–0.1)
Basophils Relative: 0 %
Eosinophils Absolute: 0 10*3/uL (ref 0.0–0.5)
Eosinophils Relative: 0 %
HCT: 39.2 % (ref 36.0–46.0)
Hemoglobin: 12.7 g/dL (ref 12.0–15.0)
Immature Granulocytes: 0 %
Lymphocytes Relative: 10 %
Lymphs Abs: 1.1 10*3/uL (ref 0.7–4.0)
MCH: 31.1 pg (ref 26.0–34.0)
MCHC: 32.4 g/dL (ref 30.0–36.0)
MCV: 95.8 fL (ref 80.0–100.0)
Monocytes Absolute: 0.6 10*3/uL (ref 0.1–1.0)
Monocytes Relative: 5 %
Neutro Abs: 9.7 10*3/uL — ABNORMAL HIGH (ref 1.7–7.7)
Neutrophils Relative %: 85 %
Platelets: 212 10*3/uL (ref 150–400)
RBC: 4.09 MIL/uL (ref 3.87–5.11)
RDW: 12.8 % (ref 11.5–15.5)
WBC: 11.5 10*3/uL — ABNORMAL HIGH (ref 4.0–10.5)
nRBC: 0 % (ref 0.0–0.2)

## 2019-05-12 LAB — COMPREHENSIVE METABOLIC PANEL
ALT: 24 U/L (ref 0–44)
AST: 18 U/L (ref 15–41)
Albumin: 4.1 g/dL (ref 3.5–5.0)
Alkaline Phosphatase: 50 U/L (ref 38–126)
Anion gap: 9 (ref 5–15)
BUN: 9 mg/dL (ref 6–20)
CO2: 23 mmol/L (ref 22–32)
Calcium: 9.2 mg/dL (ref 8.9–10.3)
Chloride: 104 mmol/L (ref 98–111)
Creatinine, Ser: 0.75 mg/dL (ref 0.44–1.00)
GFR calc Af Amer: >60 mL/min (ref >60)
GFR calc non Af Amer: >60 mL/min (ref >60)
Glucose, Bld: 112 mg/dL — ABNORMAL HIGH (ref 70–99)
Potassium: 3.3 mmol/L — ABNORMAL LOW (ref 3.5–5.1)
Sodium: 136 mmol/L (ref 135–145)
Total Bilirubin: 0.6 mg/dL (ref 0.3–1.2)
Total Protein: 7.5 g/dL (ref 6.5–8.1)

## 2019-05-12 LAB — URINALYSIS, MICROSCOPIC (REFLEX)

## 2019-05-12 LAB — WET PREP, GENITAL
Clue Cells Wet Prep HPF POC: NONE SEEN
Sperm: NONE SEEN
Yeast Wet Prep HPF POC: NONE SEEN

## 2019-05-12 LAB — PREGNANCY, URINE: Preg Test, Ur: NEGATIVE

## 2019-05-12 LAB — LIPASE, BLOOD: Lipase: 24 U/L (ref 11–51)

## 2019-05-12 MED ORDER — AZITHROMYCIN 250 MG PO TABS
1000.0000 mg | ORAL_TABLET | Freq: Once | ORAL | Status: AC
  Administered 2019-05-12: 1000 mg via ORAL
  Filled 2019-05-12: qty 4

## 2019-05-12 MED ORDER — DOXYCYCLINE HYCLATE 100 MG PO CAPS: 100.0000 mg | ORAL_CAPSULE | Freq: Two times a day (BID) | ORAL | 0 refills | Status: DC

## 2019-05-12 MED ORDER — FLUCONAZOLE 150 MG PO TABS: 150.0000 mg | ORAL_TABLET | Freq: Once | ORAL | 0 refills | Status: AC

## 2019-05-12 MED ORDER — ONDANSETRON HCL 4 MG/2ML IJ SOLN
4.0000 mg | Freq: Once | INTRAMUSCULAR | Status: AC
  Administered 2019-05-12: 4 mg via INTRAVENOUS
  Filled 2019-05-12: qty 2

## 2019-05-12 MED ORDER — MORPHINE SULFATE (PF) 4 MG/ML IV SOLN
4.0000 mg | Freq: Once | INTRAVENOUS | Status: AC
  Administered 2019-05-12: 4 mg via INTRAVENOUS
  Filled 2019-05-12: qty 1

## 2019-05-12 MED ORDER — CEFTRIAXONE SODIUM 250 MG IJ SOLR
250.0000 mg | Freq: Once | INTRAMUSCULAR | Status: AC
  Administered 2019-05-12: 250 mg via INTRAMUSCULAR
  Filled 2019-05-12: qty 250

## 2019-05-12 MED ORDER — MORPHINE SULFATE (PF) 4 MG/ML IV SOLN
6.0000 mg | Freq: Once | INTRAVENOUS | Status: AC
  Administered 2019-05-12: 6 mg via INTRAVENOUS
  Filled 2019-05-12: qty 2

## 2019-05-12 MED ORDER — IOHEXOL 300 MG/ML  SOLN
100.0000 mL | Freq: Once | INTRAMUSCULAR | Status: AC | PRN
  Administered 2019-05-12: 100 mL via INTRAVENOUS

## 2019-05-12 MED ORDER — METRONIDAZOLE 500 MG PO TABS
2000.0000 mg | ORAL_TABLET | Freq: Once | ORAL | Status: AC
  Administered 2019-05-12: 2000 mg via ORAL
  Filled 2019-05-12: qty 4

## 2019-05-12 NOTE — ED Provider Notes (Signed)
[facility] EMERGENCY DEPARTMENT Provider Note   CSN: [id number] Arrival date & time: 05/12/19  1630     History   Chief Complaint Chief Complaint  Patient presents with   Abdominal Pain    HPI Destiny Lee is a 38 y.o. female.     The history is provided by the patient and medical records. No language interpreter was used.  Abdominal Pain    38 year old obese female presenting for evaluation of abdominal pain.  Patient report gradual onset of lower abdominal pain ongoing for the past 4 days.  She described as a sharp and crampy sensation, persistent, worsening with movement, 8 out of 10.  She did report some vaginal spotting.  She is currently on the Mirena.  She denies any fever or chills no chest pain shortness of breath productive cough, no dysuria hematuria vaginal discharge.  She denies change in diet or decrease in appetite.  Past Medical History:  Diagnosis Date   Obesity     There are no active problems to display for this patient.   Past Surgical History:  Procedure Laterality Date   NO PAST SURGERIES       OB History   No obstetric history on file.      Home Medications    Prior to Admission medications   Not on File    Family History No family history on file.  Social History Social History   Tobacco Use   Smoking status: Never Smoker   Smokeless tobacco: Never Used  Substance Use Topics   Alcohol use: No   Drug use: No     Allergies   Patient has no known allergies.   Review of Systems Review of Systems  Gastrointestinal: Positive for abdominal pain.  All other systems reviewed and are negative.    Physical Exam Updated Vital Signs BP (!) 142/89 (BP Location: Left Arm)    Pulse (!) 102    Temp 99.9 F (37.7 C) (Oral)    Resp 18    Ht 5\' 5"  (1.651 m)    Wt (!) 139.3 kg    SpO2 98%    BMI 51.09 kg/m   Physical Exam Vitals signs and nursing note reviewed.  Constitutional:      General: She is not in  acute distress.    Appearance: She is well-developed.  HENT:     Head: Atraumatic.  Eyes:     Conjunctiva/sclera: Conjunctivae normal.  Neck:     Musculoskeletal: Neck supple.  Cardiovascular:     Rate and Rhythm: Normal rate and regular rhythm.  Pulmonary:     Effort: Pulmonary effort is normal.     Breath sounds: Normal breath sounds.  Abdominal:     General: Abdomen is flat.     Tenderness: There is generalized abdominal tenderness. There is no right CVA tenderness or left CVA tenderness. Negative signs include Murphy's sign, Rovsing's sign and McBurney's sign.  Genitourinary:    Comments: Chaperone present during exam.  No inguinal lymphadenopathy or inguinal hernia noted.  Normal external genitalia.  Mild discomfort with speculum insertion.  Small amount of blood noted in vaginal vault.  Cervical os is closed.  On bimanual examination right adnexal tenderness without cervical motion tenderness.  Skin:    General: Skin is warm.     Findings: No rash.  Neurological:     Mental Status: She is alert.  Psychiatric:        Mood and Affect: Mood normal.  ED Treatments / Results  Labs (all labs ordered are listed, but only abnormal results are displayed) Labs Reviewed  WET PREP, GENITAL - Abnormal; Notable for the following components:      Result Value   Trich, Wet Prep PRESENT (*)    WBC, Wet Prep HPF POC MANY (*)    All other components within normal limits  URINALYSIS, ROUTINE W REFLEX MICROSCOPIC - Abnormal; Notable for the following components:   Hgb urine dipstick TRACE (*)    All other components within normal limits  COMPREHENSIVE METABOLIC PANEL - Abnormal; Notable for the following components:   Potassium 3.3 (*)    Glucose, Bld 112 (*)    All other components within normal limits  CBC WITH DIFFERENTIAL/PLATELET - Abnormal; Notable for the following components:   WBC 11.5 (*)    Neutro Abs 9.7 (*)    All other components within normal limits  URINALYSIS,  MICROSCOPIC (REFLEX) - Abnormal; Notable for the following components:   Bacteria, UA FEW (*)    All other components within normal limits  PREGNANCY, URINE  LIPASE, BLOOD  CBC WITH DIFFERENTIAL/PLATELET  RPR  HIV ANTIBODY (ROUTINE TESTING W REFLEX)  GC/CHLAMYDIA PROBE AMP ([place]) NOT AT [facility]    EKG None  Radiology Ct Abdomen Pelvis W Contrast  Result Date: 05/12/2019 CLINICAL DATA:  Lower abdominal pain. Abd pain, appendicitis suspected EXAM: CT ABDOMEN AND PELVIS WITH CONTRAST TECHNIQUE: Multidetector CT imaging of the abdomen and pelvis was performed using the standard protocol following bolus administration of intravenous contrast. CONTRAST:  133mL OMNIPAQUE IOHEXOL 300 MG/ML  SOLN COMPARISON:  None. FINDINGS: Lower chest: The lung bases are clear. Hepatobiliary: No focal liver abnormality is seen. No gallstones, gallbladder wall thickening, or biliary dilatation. Pancreas: No ductal dilatation or inflammation. Spleen: Normal in size without focal abnormality. Adrenals/Urinary Tract: Normal adrenal glands. No hydronephrosis or perinephric edema. Homogeneous renal enhancement. 17 mm simple cyst in the lower right kidney. Urinary bladder is partially distended without wall thickening. Stomach/Bowel: Stomach is within normal limits. Appendix appears normal, for example image 50 series 2. No evidence of bowel wall thickening, distention, or inflammatory changes. Vascular/Lymphatic: No significant vascular findings are present. No enlarged abdominal or pelvic lymph nodes. Reproductive: Intrauterine device appropriately position in the uterus.rr there is a 3.6 cm simple cyst in the left ovary. Follicular cyst in the right ovary. No suspicious adnexal mass. Low-density lesions in the cervix are likely nabothian cysts. Other: No free air, free fluid, or intra-abdominal fluid collection. Small fat containing umbilical hernia. Musculoskeletal: There are no acute or suspicious osseous abnormalities.  IMPRESSION: 1. No acute abnormality or explanation for abdominal pain. Normal appendix. 2. Small fat containing umbilical hernia. Electronically Signed   By: Keith Rake M.D.   On: 05/12/2019 19:05    Procedures Procedures (including critical care time)  Medications Ordered in ED Medications  cefTRIAXone (ROCEPHIN) injection 250 mg (has no administration in time range)  azithromycin (ZITHROMAX) tablet 1,000 mg (has no administration in time range)  metroNIDAZOLE (FLAGYL) tablet 2,000 mg (has no administration in time range)  morphine 4 MG/ML injection 4 mg (4 mg Intravenous Given 05/12/19 1741)  ondansetron (ZOFRAN) injection 4 mg (4 mg Intravenous Given 05/12/19 1741)  morphine 4 MG/ML injection 6 mg (6 mg Intravenous Given 05/12/19 1855)  iohexol (OMNIPAQUE) 300 MG/ML solution 100 mL (100 mLs Intravenous Contrast Given 05/12/19 1841)     Initial Impression / Assessment and Plan / ED Course  I have reviewed the triage  vital signs and the nursing notes.  Pertinent labs & imaging results that were available during my care of the patient were reviewed by me and considered in my medical decision making (see chart for details).        BP (!) 144/88 (BP Location: Left Arm)    Pulse 94    Temp 99.9 F (37.7 C) (Oral)    Resp 16    Ht 5\' 5"  (1.651 m)    Wt (!) 139.3 kg    SpO2 98%    BMI 51.09 kg/m    Final Clinical Impressions(s) / ED Diagnoses   Final diagnoses:  Cervicitis  Trichomoniasis    ED Discharge Orders         Ordered    doxycycline (VIBRAMYCIN) 100 MG capsule  2 times daily     05/12/19 2015         5:20 PM Patient with lower abdominal pain for the past 4 days.  She does have diffuse tenderness throughout abdomen on exam but no guarding.  She is found to have a elevated temperature of 99.9 and mildly tachycardic.  Will perform pelvic examination, and obtain abdominal pelvis CT scan for further evaluation.  6:26 PM Patient does have right adnexal tenderness on  pelvic Examination without cervical motion tenderness however exam is limited due to large body habitus.  I do believe patient would benefit from an abdominal pelvis CT scan for evaluation   7:25 PM Wet prep shows presence of trichomonas as well as many WBC.  Patient will need to be cover for potential STIs with Rocephin and Zithromax and Flagyl.  Doubt TOA.  Will treat for cervicitis with doxycycline.    Fayrene Helperran, [place], PA-C 05/12/19 2015    Tegeler, Canary Brimhristopher J, MD 05/13/19 [id number]

## 2019-05-12 NOTE — ED Triage Notes (Signed)
Pt reports low abdominal pain x 3-4 days. Last BM was this morning, normal per pt report. Reports pain as cramping and voices concern that it may be due to her IUD. Denies vaginal discharge, reports spotting

## 2019-05-13 MED FILL — DOXYCYCLINE HYCLATE 100 MG: 100 | 10 days supply | Qty: 20 | Fill #0

## 2019-05-13 MED FILL — FLUCONAZOLE 150 MG TABS: 150 | 1 days supply | Qty: 1 | Fill #0

## 2019-05-14 LAB — GC/CHLAMYDIA PROBE AMP (~~LOC~~) NOT AT ARMC
Chlamydia: NEGATIVE
Neisseria Gonorrhea: POSITIVE — AB

## 2019-05-14 LAB — HIV ANTIBODY (ROUTINE TESTING W REFLEX): HIV Screen 4th Generation wRfx: NONREACTIVE

## 2019-05-14 LAB — RPR: RPR Ser Ql: NONREACTIVE

## 2019-05-24 ENCOUNTER — Other Ambulatory Visit: Payer: Self-pay

## 2019-05-24 ENCOUNTER — Ambulatory Visit (INDEPENDENT_AMBULATORY_CARE_PROVIDER_SITE_OTHER): Payer: 59 | Admitting: Podiatry

## 2019-05-24 DIAGNOSIS — G5763 Lesion of plantar nerve, bilateral lower limbs: Secondary | ICD-10-CM

## 2019-06-23 NOTE — Progress Notes (Signed)
  Subjective:  Patient ID: Margrett Rud, female    DOB: 1981/06/09,  MRN: [id number]  Chief Complaint  Patient presents with  . Neuroma    Pt states injections helped, still has some discomfort but less than previously.    38 y.o. female presents with the above complaint. Hx as above.  Review of Systems: Negative except as noted in the HPI. Denies N/V/F/Ch.  Past Medical History:  Diagnosis Date  . Obesity     Current Outpatient Medications:  .  fluconazole (DIFLUCAN) 150 MG tablet, , Disp: , Rfl:  .  doxycycline (VIBRAMYCIN) 100 MG capsule, Take 1 capsule (100 mg total) by mouth 2 (two) times daily. One po bid x 7 days (Patient not taking: Reported on 05/24/2019), Disp: 20 capsule, Rfl: 0  Social History   Tobacco Use  Smoking Status Never Smoker  Smokeless Tobacco Never Used    No Known Allergies Objective:   There were no vitals filed for this visit. There is no height or weight on file to calculate BMI. Constitutional Well developed. Well nourished.  Vascular Dorsalis pedis pulses palpable bilaterally. Posterior tibial pulses palpable bilaterally. Capillary refill normal to all digits.  No cyanosis or clubbing noted. Pedal hair growth normal.  Neurologic Normal speech. Oriented to person, place, and time. Epicritic sensation to light touch grossly present bilaterally.  Dermatologic Nails well groomed and normal in appearance. No open wounds. No skin lesions.  Orthopedic: POP 3rd IS bilat with mulder's click. POP dorsal 2nd TMT   Radiographs: None Assessment:   1. Morton's neuroma of both feet    Plan:  Patient was evaluated and treated and all questions answered.  Interdigital Neuroma, bilaterally -CMOs dispensed. -Repeat injection as below -We will have patient follow-up in 1 week for placement of neuroma pad  Procedure: Neuroma Injection Location: Bilateral 3rd interspace Skin Prep: Alcohol. Injectate: 0.5 cc 0.5% marcaine plain, 0.5 cc  dexamethasone phosphate. Disposition: Patient tolerated procedure well. Injection site dressed with a band-aid.    Return in about 3 weeks (around 06/14/2019).

## 2019-07-30 ENCOUNTER — Encounter: Payer: Self-pay | Admitting: Family Medicine

## 2019-07-30 ENCOUNTER — Other Ambulatory Visit: Payer: Self-pay

## 2019-07-30 ENCOUNTER — Ambulatory Visit (INDEPENDENT_AMBULATORY_CARE_PROVIDER_SITE_OTHER): Payer: 59 | Admitting: Family Medicine

## 2019-07-30 VITALS — BP 108/72 | HR 85 | Temp 97.5°F | Ht 65.0 in | Wt 303.0 lb

## 2019-07-30 DIAGNOSIS — R252 Cramp and spasm: Secondary | ICD-10-CM

## 2019-07-30 DIAGNOSIS — M25561 Pain in right knee: Secondary | ICD-10-CM

## 2019-07-30 DIAGNOSIS — M25562 Pain in left knee: Secondary | ICD-10-CM | POA: Diagnosis not present

## 2019-07-30 MED ORDER — MELOXICAM 15 MG PO TABS: 15.0000 mg | ORAL_TABLET | Freq: Every day | ORAL | 0 refills | Status: DC

## 2019-07-30 MED FILL — MELOXICAM 15 MG TABLET: 15 | 30 days supply | Qty: 30 | Fill #0

## 2019-07-30 NOTE — Patient Instructions (Addendum)
Ice/cold pack over area for 10-15 min twice daily.  OK to take Tylenol 1000 mg (2 extra strength tabs) or 975 mg (3 regular strength tabs) every 6 hours as needed.  Consider pickle juice.  Heat over the quad.   Quadriceps Strain Rehab It is normal to feel mild stretching, pulling, tightness, or discomfort as you do these exercises, but you should stop right away if you feel sudden pain or your pain gets worse. Stretching and range of motion exercises These exercises warm up your muscles and joints and improve the movement and flexibility of your thigh. These exercises can also help to relieve stiffness or swelling. Exercise A: Heel slides   1. Lie on your back with both knees straight. If this causes back discomfort, bend the knee of your healthy leg, placing your foot flat on the floor. 2. Slowly slide your left / right heel back toward your buttocks until you feel a gentle stretch in the front of your knee or thigh. 3. Hold for 30 seconds. Then slowly slide your heel back to the starting position. Repeat 2 times. Complete this exercise 3 times a week. Exercise B: Quadriceps stretch, prone   1. Lie on your abdomen on a firm surface, such as a bed or padded floor. 2. Bend your left / right knee and hold your ankle. If you cannot reach your ankle or pant leg, loop a belt around your foot and grab the belt instead. 3. Gently pull your heel toward your buttocks. Your knee should not slide out to the side. You should feel a stretch in the front of your thigh and knee. 4. Hold this position for 30 seconds. Repeat 2 times. Complete this exercise 3 times a week. Strengthening exercises These exercises build strength and endurance in your thigh. Endurance is the ability to use your muscles for a long time, even after your muscles get tired. Exercise C: Straight leg raises (quadriceps and hip flexors) Quality counts! Watch for signs that the quadriceps muscle is working to ensure that you are  strengthening the correct muscles and not cheating by using healthier muscles. 1. Lie on your back with your left / right leg extended and your other knee bent. 2. Tense the muscles in the front of your left / right thigh. You should see your kneecap slide up or see increased dimpling just above the knee. 3. Tighten these muscles even more and raise your leg 4-6 inches (10-15 cm) off the floor. 4. Hold for 3 seconds. 5. Keep the thigh muscles tense as you lower your leg. 6. Relax the muscles slowly and completely after each repetition. Repeat 2 times. Complete this exercise 3 times a week. Exercise D: Straight leg raises (hip extensors) 1. Lie on your belly on a bed or a firm surface with a pillow under your hips. 2. Bend your left / right knee so your foot is straight up in the air. 3. Tense your buttock muscles and lift your left / right thigh off the bed. Do not let your back arch. 4. Hold this position for 3 seconds. 5. Slowly return to the starting position. Let your muscles relax completely before doing another repetition. Repeat 2 times. Complete this exercise 3 times a week. Exercise E: Wall sits   Follow the directions for form closely. If you do not place your feet and knees properly, this can lead to knee pain. 1. Lean back against a smooth wall or door and walk your feet out 18-24 inches (  46-61 cm) from it. Place your feet hip-width apart. 2. Slowly slide down the wall or door until your knees bend  60-90 degrees. Keep your weight back and over your heels, not over your toes. Keep your thighs straight or pointing slightly outward. 3. Hold for 1 second. 4. Use your thigh and buttock muscles to push you back up to a standing position. Keep your weight through your heels while you do this. 5. Rest for 5 seconds in between repetitions. Repeat 2 times. Complete this exercise 3 times a week. Make sure you discuss any questions you have with your health care provider. Document  Released: 09/12/2005 Document Revised: 05/19/2016 Document Reviewed: 06/16/2015 Elsevier Interactive Patient Education  [employer].

## 2019-07-30 NOTE — Progress Notes (Signed)
Musculoskeletal Exam  Patient: Destiny Lee DOB: 1981-05-03  DOS: 07/30/2019  SUBJECTIVE:  Chief Complaint:   Chief Complaint  Patient presents with  . Knee Pain    Lenette Rau is a 38 y.o.  female for evaluation and treatment of R and L knee pain.   Onset:  2 weeks ago. No inj or change in activity.  Location: inside of knees Character:  aching  Progression of issue:  is unchanged Associated symptoms: no swelling, redness, bruising, catching/locking Treatment: to date has been home exercises and yoga.   Neurovascular symptoms: no She is having cramping of her thigh on L as well. Has been trying to stay hydrated and use mustard.   ROS: Musculoskeletal/Extremities: +b/l knee pain  Past Medical History:  Diagnosis Date  . Obesity     Objective: VITAL SIGNS: BP 108/72 (BP Location: Left Arm, Patient Position: Sitting, Cuff Size: Large)   Pulse 85   Temp (!) 97.5 F (36.4 C) (Temporal)   Ht 5\' 5"  (1.651 m)   Wt (!) 303 lb (137.4 kg)   SpO2 95%   BMI 50.42 kg/m  Constitutional: Well formed, well developed. No acute distress. Cardiovascular: Brisk cap refill Thorax & Lungs: No accessory muscle use Musculoskeletal: knees.   Normal active range of motion: yes.   Normal passive range of motion: yes Tenderness to palpation: yes, over medial jt line and prox tibia/distal femur b/l Deformity: no Ecchymosis: no Tests positive: None Tests negative: Stine's, McMurray's, ant/post drawer, pat app/grind, varus/valgus stress Neurologic: Normal sensory function. No focal deficits noted. Gait nml.  Psychiatric: Normal mood. Age appropriate judgment and insight. Alert & oriented x 3.    Assessment:  Acute pain of both knees - Plan: meloxicam (MOBIC) 15 MG tablet  Leg cramping  Plan: 1- Suspect knee plica, not meniscus. Ice, Tylenol, Mobic. 2- pickle juice, stay hydrated, stretches/exercises for quad provided.  F/u prn. The patient voiced understanding and agreement to  the plan.   [place], DO 07/30/19  8:58 AM

## 2019-08-07 ENCOUNTER — Telehealth: Payer: Self-pay

## 2019-08-07 ENCOUNTER — Other Ambulatory Visit: Payer: Self-pay

## 2019-08-07 ENCOUNTER — Other Ambulatory Visit (HOSPITAL_COMMUNITY)
Admission: RE | Admit: 2019-08-07 | Discharge: 2019-08-07 | Disposition: A | Discharge status: Home / Self Care | Payer: 59 | Source: Ambulatory Visit | Attending: Family Medicine | Admitting: Family Medicine

## 2019-08-07 ENCOUNTER — Encounter: Payer: Self-pay | Admitting: Family Medicine

## 2019-08-07 ENCOUNTER — Ambulatory Visit: Payer: 59 | Admitting: Family Medicine

## 2019-08-07 VITALS — BP 108/74 | HR 79 | Temp 98.3°F | Ht 65.0 in | Wt 304.0 lb

## 2019-08-07 DIAGNOSIS — R1030 Lower abdominal pain, unspecified: Secondary | ICD-10-CM | POA: Diagnosis not present

## 2019-08-07 DIAGNOSIS — M545 Low back pain, unspecified: Secondary | ICD-10-CM

## 2019-08-07 DIAGNOSIS — R8290 Unspecified abnormal findings in urine: Secondary | ICD-10-CM

## 2019-08-07 DIAGNOSIS — M533 Sacrococcygeal disorders, not elsewhere classified: Secondary | ICD-10-CM | POA: Insufficient documentation

## 2019-08-07 LAB — POCT URINALYSIS DIP (MANUAL ENTRY)
Bilirubin, UA: NEGATIVE
Blood, UA: NEGATIVE
Glucose, UA: NEGATIVE mg/dL
Ketones, POC UA: NEGATIVE mg/dL
Leukocytes, UA: NEGATIVE
Nitrite, UA: NEGATIVE
Spec Grav, UA: 1.02 (ref 1.010–1.025)
Urobilinogen, UA: 1 E.U./dL
pH, UA: 6.5 (ref 5.0–8.0)

## 2019-08-07 MED ORDER — SAXENDA 18 MG/3ML ~~LOC~~ SOPN: 0.6000 mg | PEN_INJECTOR | Freq: Every day | SUBCUTANEOUS | 2 refills | Status: AC

## 2019-08-07 MED ORDER — METRONIDAZOLE 500 MG PO TABS: 500.0000 mg | ORAL_TABLET | Freq: Two times a day (BID) | ORAL | 0 refills | Status: AC

## 2019-08-07 MED ORDER — AZITHROMYCIN 250 MG PO TABS
500.0000 mg | ORAL_TABLET | Freq: Once | ORAL | Status: AC
  Administered 2019-08-07: 16:00:00 500 mg via ORAL

## 2019-08-07 MED ORDER — CEFTRIAXONE SODIUM 250 MG IJ SOLR
250.0000 mg | Freq: Once | INTRAMUSCULAR | Status: AC
  Administered 2019-08-07: 17:00:00 250 mg via INTRAMUSCULAR

## 2019-08-07 MED ORDER — METHYLPREDNISOLONE ACETATE 40 MG/ML IJ SUSP
40.0000 mg | Freq: Once | INTRAMUSCULAR | Status: AC
  Administered 2019-08-07: 40 mg via INTRA_ARTICULAR

## 2019-08-07 MED FILL — METRONIDAZOLE 500 MG TABS: 500 | 7 days supply | Qty: 14 | Fill #0

## 2019-08-07 NOTE — Progress Notes (Signed)
Chief Complaint  Patient presents with  . Back Pain    one month    Subjective: Patient is a 38 y.o. female here for a few issues.  Patient has lower abdominal pain for the past 2 months.  3 months ago, she went to the emergency department for similar issue and was found to have chlamydia and gonorrhea.  She was treated with the appropriate medications and sent home with doxycycline to treat for possible cervicitis.  She was noncompliant with that but notes they did not palpate her cervix.  She has been sexually active with 2 partners since that time and did not use protection with one partner she is concerned about.  Denies any pain with urination, discharge, bleeding, or fevers.  Morbid obesity Patient has had issues losing weight.  She has an interest in [place].  She has failed phentermine.  Diet is healthy overall, tries to walk.  Low back pain Has had back pain for several months, bilateral.  No specific injury or change in activity.  She does believe her weight is contributing to her pain.  No loss of control of bowel or bladder function.  She was given stretches and exercises for her low back which she reports compliance with.  She has never seen physical therapy.  ROS: Endo: No wt loss MSK : +back pain  Past Medical History:  Diagnosis Date  . Obesity     Objective: BP 108/74 (BP Location: Left Arm, Patient Position: Sitting, Cuff Size: Large)   Pulse 79   Temp 98.3 F (36.8 C) (Oral)   Ht 5\' 5"  (1.651 m)   Wt (!) 304 lb (137.9 kg)   SpO2 95%   BMI 50.59 kg/m  General: Awake, appears stated age HEENT: MMM, EOMi Heart: RRR Lungs: CTAB, no rales, wheezes or rhonchi. No accessory muscle use GI:  Soft, nontender, nondistended, BS+ MSK: mild ttp over lumbar paraspinal musculature bilaterally and bilateral SI joints Psych: Age appropriate judgment and insight, normal affect and mood  Procedure note; SI joint injection Verbal consent obtained. The PSIS's were palpated  and demarcated with an otoscope speculum tip just medial to the side of interest. The area was cleaned with alcohol. The joint was entered and 40 mg DepoMedrol with 2 mL of 1% lidocaine was injected. The area was then bandaged. This was repeated on the contralateral side. There were no complications noted.  The patient tolerated the procedure well.   Assessment and Plan: Lower abdominal pain - Plan: metroNIDAZOLE (FLAGYL) 500 MG tablet, cefTRIAXone (ROCEPHIN) injection 250 mg, azithromycin (ZITHROMAX) tablet 500 mg  Low back pain, unspecified back pain laterality, unspecified chronicity, unspecified whether sciatica present - Plan: POCT urinalysis dipstick, Urine cytology ancillary only([place]), cefTRIAXone (ROCEPHIN) injection 250 mg, azithromycin (ZITHROMAX) tablet 500 mg, methylPREDNISolone acetate (DEPO-MEDROL) injection 40 mg  Abnormal urine - Plan: Urine cytology ancillary only([place]), Urine Culture, Urine cytology ancillary only([place]), cefTRIAXone (ROCEPHIN) injection 250 mg, azithromycin (ZITHROMAX) tablet 500 mg  Morbid obesity (HCC) - Plan: Liraglutide -Weight Management (SAXENDA) 18 MG/3ML SOPN  Sacroiliac dysfunction - Plan: methylPREDNISolone acetate (DEPO-MEDROL) injection 40 mg, PR INJECT TRIGGER POINT, 1 OR 2  1-we will treat for trichomonas as she had this last time.  Will cover for gonorrhea and chlamydia while she is here.  Practice safe sex.  Do not drink alcohol while on the Flagyl.  Urine ancillary testing is pending. 2-SI joint injections today.  Continue stretches and exercises.  Weight loss encouraged.  Heat, Tylenol, ice.  Will consider physical therapy in the next month if no improvement. 3-S[place] was to be called in as she has failed phentermine and conservative diet/exercise.  Offered referral to the weight loss clinic but she would like to see how things go with the [place] first. The patient voiced understanding and agreement to the  plan.  [place], DO 08/07/19  4:37 PM

## 2019-08-07 NOTE — Addendum Note (Signed)
Addended by: Sanda Linger on: 08/07/2019 03:05 PM   Modules accepted: Orders

## 2019-08-07 NOTE — Telephone Encounter (Signed)
PA initiated via Covermymeds; KEY: [id number]. Awaiting determination.

## 2019-08-07 NOTE — Patient Instructions (Addendum)
Keep doing the stretches/exercises.  Ice/cold pack over area for 10-15 min twice daily.  Heat (pad or rice pillow in microwave) over affected area, 10-15 minutes twice daily.    Let me know if you are interested in a breast reduction and we can set that up.  Do not drink alcohol during and for 3 days after finishing the medication.  Let me know if there are cost issues with the [place].   Keep the diet clean and stay active.  Let us know if you need anything.

## 2019-08-09 LAB — URINE CULTURE
MICRO NUMBER:: 1089181
SPECIMEN QUALITY:: ADEQUATE

## 2019-08-09 MED ORDER — METHYLPREDNISOLONE ACETATE 80 MG/ML IJ SUSP: 80.0000 mg | Freq: Once | INTRAMUSCULAR | Status: DC

## 2019-08-09 MED ORDER — METHYLPREDNISOLONE ACETATE 40 MG/ML IJ SUSP
40.0000 mg | Freq: Once | INTRAMUSCULAR | Status: AC
  Administered 2019-08-09: 40 mg via INTRA_ARTICULAR

## 2019-08-09 MED FILL — SAXENDA 18 MG/3 ML PEN: 18 | 30 days supply | Qty: 15 | Fill #0

## 2019-08-09 MED FILL — ULTICARE PEN NDL 8MM 31G: 31G X 8 MM | 90 days supply | Qty: 100 | Fill #0

## 2019-08-09 NOTE — Addendum Note (Signed)
Addended by: Sharon Seller B on: 08/09/2019 10:46 AM   Modules accepted: Orders

## 2019-08-09 NOTE — Telephone Encounter (Signed)
PA approved from 08/09/2019 to 12/06/2019.

## 2019-08-09 NOTE — Telephone Encounter (Signed)
Patient notified by Mel Almond

## 2019-08-15 LAB — URINE CYTOLOGY ANCILLARY ONLY
Bacterial Vaginitis-Urine: POSITIVE — AB
Bacterial Vaginitis-Urine: POSITIVE — AB
Bacterial Vaginitis-Urine: POSITIVE — AB
Bacterial Vaginitis-Urine: POSITIVE — AB
Bacterial Vaginitis-Urine: POSITIVE — AB
Candida Urine: NEGATIVE
Chlamydia: NEGATIVE
Comment: NEGATIVE
Comment: NEGATIVE
Comment: NORMAL
Neisseria Gonorrhea: NEGATIVE
Trichomonas: POSITIVE — AB

## 2019-08-27 ENCOUNTER — Encounter: Payer: Self-pay | Admitting: Family Medicine

## 2019-08-27 ENCOUNTER — Other Ambulatory Visit: Payer: Self-pay

## 2019-08-27 ENCOUNTER — Ambulatory Visit (INDEPENDENT_AMBULATORY_CARE_PROVIDER_SITE_OTHER): Payer: 59 | Admitting: Family Medicine

## 2019-08-27 VITALS — Temp 97.4°F

## 2019-08-27 DIAGNOSIS — J302 Other seasonal allergic rhinitis: Secondary | ICD-10-CM | POA: Diagnosis not present

## 2019-08-27 MED ORDER — LEVOCETIRIZINE DIHYDROCHLORIDE 5 MG PO TABS: 5.0000 mg | ORAL_TABLET | Freq: Every evening | ORAL | 2 refills | Status: DC

## 2019-08-27 MED ORDER — PREDNISONE 20 MG PO TABS: 40.0000 mg | ORAL_TABLET | Freq: Every day | ORAL | 0 refills | Status: AC

## 2019-08-27 MED FILL — LEVOCETIRIZINE 5 MG TABLET: 5 | 30 days supply | Qty: 30 | Fill #0

## 2019-08-27 MED FILL — predniSONE 20 MG TABS: 20 | 5 days supply | Qty: 10 | Fill #0

## 2019-08-27 NOTE — Progress Notes (Signed)
Chief Complaint  Patient presents with  . Sore Throat    allergies  . Headache    last night    Margrett Rud here for URI complaints. Due to COVID-19 pandemic, we are interacting via web portal for an electronic face-to-face visit. I verified patient's ID using 2 identifiers. Patient agreed to proceed with visit via this method. Patient is at home, I am at office. Patient and I are present for visit.   Duration: 1 day  Associated symptoms: sinus congestion, rhinorrhea, itchy watery eyes, sneezing and sore throat Denies: sinus pain, ear pain, ear drainage, wheezing, shortness of breath, myalgia and fevers, GI s/s's Treatment to date: allegra, tea Sick contacts: No  ROS:  Const: Denies fevers HEENT: As noted in HPI Lungs: No SOB  Past Medical History:  Diagnosis Date  . Obesity     Temp (!) 97.4 F (36.3 C) (Oral)  No conversational dyspnea Age appropriate judgment and insight Nml affect and mood  Seasonal allergic rhinitis, unspecified trigger - Plan: predniSONE (DELTASONE) 20 MG tablet, levocetirizine (XYZAL) 5 MG tablet  Change Allegra to Xyzal. If no improvement, use pred.  Has been tested for COVID this AM, await results.  Continue to push fluids, practice good hand hygiene, cover mouth when coughing. F/u prn. If starting to experience fevers, shaking, or shortness of breath, seek immediate care. Pt voiced understanding and agreement to the plan.  [place], DO 08/27/19 11:23 AM

## 2019-09-03 ENCOUNTER — Encounter: Payer: Self-pay | Admitting: Family Medicine

## 2019-09-03 ENCOUNTER — Other Ambulatory Visit: Payer: Self-pay

## 2019-09-03 ENCOUNTER — Ambulatory Visit (INDEPENDENT_AMBULATORY_CARE_PROVIDER_SITE_OTHER): Payer: 59 | Admitting: Family Medicine

## 2019-09-03 VITALS — BP 114/78 | HR 76 | Temp 97.0°F | Ht 65.0 in

## 2019-09-03 DIAGNOSIS — M26622 Arthralgia of left temporomandibular joint: Secondary | ICD-10-CM | POA: Diagnosis not present

## 2019-09-03 MED ORDER — CYCLOBENZAPRINE HCL 10 MG PO TABS: 5.0000 mg | ORAL_TABLET | Freq: Three times a day (TID) | ORAL | 0 refills | Status: DC | PRN

## 2019-09-03 MED FILL — CYCLOBENZAPRINE HCL 10 MG T: 10 | 7 days supply | Qty: 21 | Fill #0

## 2019-09-03 NOTE — Patient Instructions (Addendum)
You do not have an ear infection.  Continue the Flonase.  Take Flexeril (cyclobenzaprine) 1-2 hours before planned bedtime. If it makes you drowsy, do not take during the day. You can try half a tab the following night.  Go back on the meloxicam.  OK to take Tylenol 1000 mg (2 extra strength tabs) or 975 mg (3 regular strength tabs) every 6 hours as needed.  Ice/cold pack over area for 10-15 min twice daily.   Temporomandibular Joint Syndrome  Temporomandibular joint syndrome (TMJ syndrome) is a condition that causes pain in the temporomandibular joints. These joints are located near your ears and allow your jaw to open and close. For people with TMJ syndrome, chewing, biting, or other movements of the jaw can be difficult or painful. TMJ syndrome is often mild and goes away within a few weeks. However, sometimes the condition becomes a long-term (chronic) problem. What are the causes? This condition may be caused by:  Grinding your teeth or clenching your jaw. Some people do this when they are under stress.  Arthritis.  Injury to the jaw.  Head or neck injury.  Teeth or dentures that are not aligned well. In some cases, the cause of TMJ syndrome may not be known. What are the signs or symptoms? The most common symptom of this condition is an aching pain on the side of the head in the area of the TMJ. Other symptoms may include:  Pain when moving your jaw, such as when chewing or biting.  Being unable to open your jaw all the way.  Making a clicking sound when you open your mouth.  Headache.  Earache.  Neck or shoulder pain. How is this diagnosed? This condition may be diagnosed based on:  Your symptoms and medical history.  A physical exam. Your health care provider may check the range of motion of your jaw.  Imaging tests, such as X-rays or an MRI. You may also need to see your dentist, who will determine if your teeth and jaw are lined up correctly. How is this  treated? TMJ syndrome often goes away on its own. If treatment is needed, the options may include:  Eating soft foods and applying ice or heat.  Medicines to relieve pain or inflammation.  Medicines or massage to relax the muscles.  A splint, bite plate, or mouthpiece to prevent teeth grinding or jaw clenching.  Relaxation techniques or counseling to help reduce stress.  A therapy for pain in which an electrical current is applied to the nerves through the skin (transcutaneous electrical nerve stimulation).  Acupuncture. This is sometimes helpful to relieve pain.  Jaw surgery. This is rarely needed. Follow these instructions at home:  Eating and drinking  Eat a soft diet if you are having trouble chewing.  Avoid foods that require a lot of chewing. Do not chew gum. General instructions  Take over-the-counter and prescription medicines only as told by your health care provider.  If directed, put ice on the painful area. ? Put ice in a plastic bag. ? Place a towel between your skin and the bag. ? Leave the ice on for 20 minutes, 2-3 times a day.  Apply a warm, wet cloth (warm compress) to the painful area as directed.  Massage your jaw area and do any jaw stretching exercises as told by your health care provider.  If you were given a splint, bite plate, or mouthpiece, wear it as told by your health care provider.  Keep all follow-up  visits as told by your health care provider. This is important. Contact a health care provider if:  You are having trouble eating.  You have new or worsening symptoms. Get help right away if:  Your jaw locks open or closed. Summary  Temporomandibular joint syndrome (TMJ syndrome) is a condition that causes pain in the temporomandibular joints. These joints are located near your ears and allow your jaw to open and close.  TMJ syndrome is often mild and goes away within a few weeks. However, sometimes the condition becomes a long-term  (chronic) problem.  Symptoms include an aching pain on the side of the head in the area of the TMJ, pain when chewing or biting, and being unable to open your jaw all the way. You may also make a clicking sound when you open your mouth.  TMJ syndrome often goes away on its own. If treatment is needed, it may include medicines to relieve pain, reduce inflammation, or relax the muscles. A splint, bite plate, or mouthpiece may also be used to prevent teeth grinding or jaw clenching. This information is not intended to replace advice given to you by your health care provider. Make sure you discuss any questions you have with your health care provider. Document Released: 06/07/2001 Document Revised: 11/24/2017 Document Reviewed: 10/24/2017 Elsevier Patient Education  2020 [employer].

## 2019-09-03 NOTE — Progress Notes (Signed)
Chief Complaint  Patient presents with  . Ear Pain    Pt is here for left ear pain. Duration: 2 days Progression: unchanged Associated symptoms: jaw pain, sinus pressure Denies: sore throat, congestion, post nasal drip and sneezing, bleeding, or discharge from ear Treatment to date: INCS  ROS:  HEENT: +ear pain Costitutional: Denies fevers  Past Medical History:  Diagnosis Date  . Obesity    History reviewed. No pertinent family history. Past Surgical History:  Procedure Laterality Date  . NO PAST SURGERIES      BP 114/78 (BP Location: Right Arm, Patient Position: Sitting, Cuff Size: Large)   Pulse 76   Temp (!) 97 F (36.1 C) (Temporal)   Ht 5\' 5"  (1.651 m)   BMI 50.59 kg/m  General: Awake, alert, appearing stated age 38:  L ear- Canal patent without drainage or erythema, TM is neg R ear- canal patent without drainage or erythema, TM is neg Nose- nares patent and without discharge Mouth- Lips, gums and dentition unremarkable, pharynx is without erythema or exudate MSK: TTP over L TMJ, it does click through ROM and deviates to the R Lungs: Normal effort, no accessory muscle use Psych: Age appropriate judgment and insight, normal mood and affect  Arthralgia of left temporomandibular joint - Plan: cyclobenzaprine (FLEXERIL) 10 MG tablet  Warnings about Flexeril given. Ice, Tylenol, info provided.  F/u prn. Pt voiced understanding and agreement to the plan.  [place], DO 09/03/19 11:25 AM

## 2019-09-24 ENCOUNTER — Other Ambulatory Visit: Payer: Self-pay

## 2019-09-25 ENCOUNTER — Other Ambulatory Visit: Payer: Self-pay

## 2019-09-25 ENCOUNTER — Encounter: Payer: Self-pay | Admitting: Family Medicine

## 2019-09-25 ENCOUNTER — Ambulatory Visit (INDEPENDENT_AMBULATORY_CARE_PROVIDER_SITE_OTHER): Payer: 59 | Admitting: Family Medicine

## 2019-09-25 VITALS — Temp 99.8°F

## 2019-09-25 DIAGNOSIS — R509 Fever, unspecified: Secondary | ICD-10-CM | POA: Diagnosis not present

## 2019-09-25 MED ORDER — ONDANSETRON 4 MG PO TBDP: 4.0000 mg | ORAL_TABLET | Freq: Three times a day (TID) | ORAL | 0 refills | Status: DC | PRN

## 2019-09-25 MED FILL — ONDANSETRON ODT 4 MG TABLET: 4 | 7 days supply | Qty: 20 | Fill #0

## 2019-09-25 NOTE — Progress Notes (Signed)
Chief Complaint  Patient presents with  . Fever    since monday 09/23/2019  . Generalized Body Aches    Loyalty Arentz here for URI complaints. Due to COVID-19 pandemic, we are interacting via web portal for an electronic face-to-face visit. I verified patient's ID using 2 identifiers. Patient agreed to proceed with visit via this method. Patient is at home, I am at office. Patient and I are present for visit.   Duration: 2 days  Associated symptoms:Fever (101 F), myalgia, nausea and dry cough  Denies: sinus congestion, sinus pain, rhinorrhea, itchy watery eyes, ear pain, ear drainage, sore throat, wheezing, shortness of breath and vomiting, diarrhea Treatment to date: Tylenol Sick contacts: No  ROS:  Const: + fevers HEENT: As noted in HPI Lungs: No SOB  Past Medical History:  Diagnosis Date  . Obesity     Temp 99.8 F (37.7 C) (Oral)  No conversational dyspnea Age appropriate judgment and insight Nml affect and mood  Febrile illness - Plan: ondansetron (ZOFRAN-ODT) 4 MG disintegrating tablet  Ibuprofen, Tylenol, social distancing.  Continue to push fluids, practice good hand hygiene, cover mouth when coughing. F/u prn. If starting to experience increasing fluid loss, shaking, or shortness of breath, seek immediate care. Pt voiced understanding and agreement to the plan.  [place], DO 09/25/19 8:47 AM

## 2019-11-04 ENCOUNTER — Other Ambulatory Visit: Payer: Self-pay

## 2019-11-04 ENCOUNTER — Ambulatory Visit (INDEPENDENT_AMBULATORY_CARE_PROVIDER_SITE_OTHER): Payer: 59 | Admitting: Obstetrics & Gynecology

## 2019-11-04 ENCOUNTER — Encounter: Payer: Self-pay | Admitting: Obstetrics & Gynecology

## 2019-11-04 VITALS — BP 126/61 | HR 77 | Ht 65.0 in | Wt 313.0 lb

## 2019-11-04 DIAGNOSIS — Z124 Encounter for screening for malignant neoplasm of cervix: Secondary | ICD-10-CM | POA: Diagnosis not present

## 2019-11-04 DIAGNOSIS — Z1151 Encounter for screening for human papillomavirus (HPV): Secondary | ICD-10-CM

## 2019-11-04 DIAGNOSIS — T8332XA Displacement of intrauterine contraceptive device, initial encounter: Secondary | ICD-10-CM

## 2019-11-04 DIAGNOSIS — Z8742 Personal history of other diseases of the female genital tract: Secondary | ICD-10-CM

## 2019-11-04 DIAGNOSIS — B9689 Other specified bacterial agents as the cause of diseases classified elsewhere: Secondary | ICD-10-CM | POA: Diagnosis not present

## 2019-11-04 DIAGNOSIS — N760 Acute vaginitis: Secondary | ICD-10-CM | POA: Diagnosis not present

## 2019-11-04 DIAGNOSIS — N898 Other specified noninflammatory disorders of vagina: Secondary | ICD-10-CM

## 2019-11-04 DIAGNOSIS — Z01419 Encounter for gynecological examination (general) (routine) without abnormal findings: Secondary | ICD-10-CM

## 2019-11-04 DIAGNOSIS — Z113 Encounter for screening for infections with a predominantly sexual mode of transmission: Secondary | ICD-10-CM | POA: Diagnosis not present

## 2019-11-04 NOTE — Patient Instructions (Signed)

## 2019-11-04 NOTE — Progress Notes (Signed)
Subjective:     Destiny Lee is a 39 y.o. female here for a routine exam.  Current complaints: Has prev abnormal PAP did not follow up. Sexually active intermittently. Partner with issues with impotence.  Pt has not has IUD check prev. But, she has not had sx to  suggest that it is not in place.      Gynecologic History No LMP recorded. (Menstrual status: IUD). Contraception: IUD  06/2016 Last Pap: 3 years prev. Results were: abnormal but, pt did not follow up Last mammogram: n/a  Obstetric History OB History  Gravida Para Term Preterm AB Living  2 1 1   1 1   SAB TAB Ectopic Multiple Live Births    1     1    # Outcome Date GA Lbr Len/2nd Weight Sex Delivery Anes PTL Lv  2 TAB 1999          1 Term 1999 [redacted]w[redacted]d   F Vag-Spont None N LIV   The following portions of the patient's history were reviewed and updated as appropriate: allergies, current medications, past family history, past medical history, past social history, past surgical history and problem list.  Review of Systems Pertinent items are noted in HPI.    Objective:  BP 126/61   Pulse 77   Ht 5\' 5"  (1.651 m)   Wt (!) 313 lb (142 kg)   BMI 52.09 kg/m   General Appearance:    Alert, cooperative, no distress, appears stated age  Head:    Normocephalic, without obvious abnormality, atraumatic  Eyes:    conjunctiva/corneas clear, EOM's intact, both eyes  Ears:    Normal external ear canals, both ears  Nose:   Nares normal, septum midline, mucosa normal, no drainage    or sinus tenderness  Throat:   Lips, mucosa, and tongue normal; teeth and gums normal  Neck:   Supple, symmetrical, trachea midline, no adenopathy;    thyroid:  no enlargement/tenderness/nodules  Back:     Symmetric, no curvature, ROM normal, no CVA tenderness  Lungs:     respirations unlabored  Chest Wall:    No tenderness or deformity   Heart:    Regular rate and rhythm  Breast Exam:    No tenderness, masses, or nipple abnormality  Abdomen:     Soft,  non-tender, bowel sounds active all four quadrants,    no masses, no organomegaly  Genitalia:    Normal female without lesion, discharge or tenderness; IUD strings not seen     Extremities:   Extremities normal, atraumatic, no cyanosis or edema  Pulses:   2+ and symmetric all extremities  Skin:   Skin color, texture, turgor normal, no rashes or lesions    Assessment:    Healthy female exam.   IUD strings not seen    Plan:   Diagnoses and all orders for this visit:  Well woman exam with routine gynecological exam -     Cytology - PAP( [place]) -     Cervicovaginal ancillary only( [place]) -     US PELVIS TRANSVAGINAL NON-OB (TV ONLY); Future  Routine screening for STI (sexually transmitted infection) -     Cytology - PAP( [place]) -     Cervicovaginal ancillary only( [place])  History of abnormal cervical Pap smear -     Cytology - PAP( [place]) -     Cervicovaginal ancillary only( [place])  Intrauterine contraceptive device threads lost, initial encounter -  US PELVIS TRANSVAGINAL NON-OB (TV ONLY); Future

## 2019-11-05 LAB — CYTOLOGY - PAP
Chlamydia: NEGATIVE
Comment: NEGATIVE
Comment: NEGATIVE
Comment: NORMAL
Diagnosis: NEGATIVE
High risk HPV: NEGATIVE
Neisseria Gonorrhea: NEGATIVE

## 2019-11-05 LAB — CERVICOVAGINAL ANCILLARY ONLY
Bacterial Vaginitis (gardnerella): POSITIVE — AB
Comment: NEGATIVE
Comment: NEGATIVE
Trichomonas: POSITIVE — AB

## 2019-11-06 ENCOUNTER — Telehealth: Payer: Self-pay

## 2019-11-06 DIAGNOSIS — B9689 Other specified bacterial agents as the cause of diseases classified elsewhere: Secondary | ICD-10-CM

## 2019-11-06 DIAGNOSIS — N760 Acute vaginitis: Secondary | ICD-10-CM

## 2019-11-06 MED ORDER — METRONIDAZOLE 500 MG PO TABS: 500.0000 mg | ORAL_TABLET | Freq: Two times a day (BID) | ORAL | 0 refills | Status: DC

## 2019-11-06 MED FILL — metroNIDAZOLE 500 MG TABS: 500 | 7 days supply | Qty: 14 | Fill #0

## 2019-11-06 NOTE — Telephone Encounter (Signed)
Called pt regarding positive BV and Trich results. Unable to leave vm because mailbox was full.  Flagyl 500 mg PO BID x 7 days was sent to pt's pharmacy. STD form was faxed to the [facility]. Abhijay Morriss l Antania Hoefling, CMA

## 2019-11-07 ENCOUNTER — Other Ambulatory Visit: Payer: Self-pay | Admitting: Obstetrics & Gynecology

## 2019-11-11 ENCOUNTER — Encounter: Payer: Self-pay | Admitting: Obstetrics & Gynecology

## 2019-11-12 ENCOUNTER — Ambulatory Visit (HOSPITAL_BASED_OUTPATIENT_CLINIC_OR_DEPARTMENT_OTHER): Admission: RE | Admit: 2019-11-12 | Payer: 59 | Source: Ambulatory Visit

## 2019-11-25 ENCOUNTER — Telehealth: Payer: Self-pay

## 2019-11-25 NOTE — Telephone Encounter (Signed)
PA initiated via Covermymeds; KEY: [id number]. Awaiting determination.

## 2019-11-27 NOTE — Telephone Encounter (Signed)
PA approved.   The request has been approved. The authorization is effective for a maximum of 12 fills from 11/27/2019 to 11/25/2020, as long as the member is enrolled in their current health plan. The request was reviewed and approved by a licensed clinical pharmacist. This request is approved for up to 18mL per 30 days. A written notification letter will follow with additional details.

## 2019-12-20 MED FILL — SAXENDA 18 MG/3 ML PEN: 18 | 30 days supply | Qty: 15 | Fill #1

## 2020-01-17 ENCOUNTER — Other Ambulatory Visit: Payer: Self-pay | Admitting: Family Medicine

## 2020-01-21 ENCOUNTER — Other Ambulatory Visit: Payer: Self-pay | Admitting: Family Medicine

## 2020-01-21 ENCOUNTER — Telehealth: Payer: Self-pay | Admitting: Family Medicine

## 2020-01-21 MED ORDER — SUMATRIPTAN SUCCINATE 100 MG PO TABS: 100.0000 mg | ORAL_TABLET | ORAL | 5 refills | Status: DC | PRN

## 2020-01-21 MED FILL — SUMATRIPTAN SUCC 100 MG TAB: 100 | 30 days supply | Qty: 8 | Fill #0

## 2020-01-21 NOTE — Telephone Encounter (Signed)
PCP completed FMLA Faxed and received fax confirmation. Copied to scan///the patient was given the original.

## 2020-01-31 ENCOUNTER — Other Ambulatory Visit: Payer: Self-pay | Admitting: Family Medicine

## 2020-01-31 ENCOUNTER — Other Ambulatory Visit: Payer: Self-pay

## 2020-01-31 ENCOUNTER — Encounter: Payer: Self-pay | Admitting: Family Medicine

## 2020-01-31 ENCOUNTER — Ambulatory Visit: Payer: 59 | Admitting: Family Medicine

## 2020-01-31 VITALS — BP 112/78 | HR 75 | Temp 95.5°F

## 2020-01-31 DIAGNOSIS — R358 Other polyuria: Secondary | ICD-10-CM

## 2020-01-31 DIAGNOSIS — R3589 Other polyuria: Secondary | ICD-10-CM

## 2020-01-31 DIAGNOSIS — H6983 Other specified disorders of Eustachian tube, bilateral: Secondary | ICD-10-CM

## 2020-01-31 DIAGNOSIS — J302 Other seasonal allergic rhinitis: Secondary | ICD-10-CM | POA: Diagnosis not present

## 2020-01-31 LAB — URINALYSIS
Bilirubin Urine: NEGATIVE
Hgb urine dipstick: NEGATIVE
Ketones, ur: NEGATIVE
Leukocytes,Ua: NEGATIVE
Nitrite: NEGATIVE
Specific Gravity, Urine: 1.025 (ref 1.000–1.030)
Total Protein, Urine: NEGATIVE
Urine Glucose: NEGATIVE
Urobilinogen, UA: 0.2 (ref 0.0–1.0)
pH: 6 (ref 5.0–8.0)

## 2020-01-31 LAB — HEMOGLOBIN A1C: Hgb A1c MFr Bld: 5.2 % (ref 4.6–6.5)

## 2020-01-31 MED ORDER — OZEMPIC (0.25 OR 0.5 MG/DOSE) 2 MG/1.5ML ~~LOC~~ SOPN: PEN_INJECTOR | SUBCUTANEOUS | 1 refills | Status: DC

## 2020-01-31 MED ORDER — LEVOCETIRIZINE DIHYDROCHLORIDE 5 MG PO TABS: 5.0000 mg | ORAL_TABLET | Freq: Every evening | ORAL | 2 refills | Status: DC

## 2020-01-31 MED ORDER — PREDNISONE 20 MG PO TABS: 40.0000 mg | ORAL_TABLET | Freq: Every day | ORAL | 0 refills | Status: AC

## 2020-01-31 MED ORDER — FLUTICASONE PROPIONATE 50 MCG/ACT NA SUSP: 2.0000 | Freq: Every day | NASAL | 6 refills | Status: DC

## 2020-01-31 MED FILL — FLUTICASONE PROP 50 MCG SPR: 50 | 30 days supply | Qty: 16 | Fill #0

## 2020-01-31 MED FILL — LEVOCETIRIZINE 5 MG TABLET: 5 | 90 days supply | Qty: 90 | Fill #0

## 2020-01-31 MED FILL — predniSONE 20 MG TABS: 20 | 5 days supply | Qty: 10 | Fill #0

## 2020-01-31 MED FILL — OZEMPIC 0.25 OR 0.5 MG/DOSE: 2 | 28 days supply | Qty: 2 | Fill #0

## 2020-01-31 NOTE — Progress Notes (Signed)
Chief Complaint  Patient presents with  . Ear Pain    both    Pt is here for bilateral ear pain. Duration: 3 days Progression: unchanged Associated symptoms: allergy s/s's,  Denies: sore throat, bleeding, or discharge from ear Treatment to date: None  Patient has a several day history of increased urination.  She cut down her liquid intake and noticed no change.  Bowel movements are fair and unchanged.  She does not have a history of diabetes.  This does not feel a classic urinary tract infection.  She does not have any discharge, bleeding, abdominal pain, or dysuria.  Patient has a history of obesity for which she was taking [place].  She took it 2 years ago and it worked very well but less so at this time.  She is compliant.  She does not like taking a shot daily but would be willing to look into something weekly.  She is not having any side effects otherwise.  Past Medical History:  Diagnosis Date  . Obesity    Past Surgical History:  Procedure Laterality Date  . NO PAST SURGERIES      BP 112/78 (BP Location: Right Arm, Patient Position: Sitting, Cuff Size: Large)   Pulse 75   Temp (!) 95.5 F (35.3 C) (Temporal)   SpO2 98%  General: Awake, alert, appearing stated age HEENT:  L ear: Canal patent without drainage or erythema, TM is mildly retracted without fluid or purulence R ear: canal patent without drainage or erythema, TM is slightly retracted without fluid or purulence Nose: nares patent and without discharge Mouth: Lips, gums and dentition unremarkable, pharynx is without erythema or exudate Neck: No adenopathy Heart: Regular rate and rhythm Lungs: CTAB; Normal effort, no accessory muscle use Abdomen: Bowel sounds present, large panniculus, soft, nontender, nondistended, no masses or organomegaly Psych: Age appropriate judgment and insight, normal mood and affect  ETD (Eustachian tube dysfunction), bilateral - Plan: predniSONE (DELTASONE) 20 MG tablet  Seasonal  allergic rhinitis, unspecified trigger - Plan: levocetirizine (XYZAL) 5 MG tablet, fluticasone (FLONASE) 50 MCG/ACT nasal spray  Polyuria - Plan: Urinalysis, Hemoglobin A1c  Morbid obesity (HCC) - Plan: Semaglutide,0.25 or 0.5MG /DOS, (OZEMPIC, 0.25 OR 0.5 MG/DOSE,) 2 MG/1.5ML SOPN  1-prednisone burst 2-try to control allergies, go back on Xyzal, add Flonase 3-check UA and A1c; if both are normal we will try to normalize bowel movements 4-change Saxenda to Ozempic. F/u in 2 months for a physical and recheck of #4.  Follow-up as needed otherwise Pt voiced understanding and agreement to the plan.  [name] [place], DO 01/31/20 7:58 AM

## 2020-01-31 NOTE — Patient Instructions (Addendum)
Flonase daily can be helpful.   Go back on Xyzal.  Give [place] 2-3 business days to get the results of your labs back.   Let me know if there are cost issues with the weekly shot (Ozempic).  Let us know if you need anything.

## 2020-03-25 ENCOUNTER — Other Ambulatory Visit: Payer: Self-pay

## 2020-03-25 ENCOUNTER — Ambulatory Visit (INDEPENDENT_AMBULATORY_CARE_PROVIDER_SITE_OTHER): Payer: 59 | Admitting: Internal Medicine

## 2020-03-25 ENCOUNTER — Encounter: Payer: Self-pay | Admitting: Internal Medicine

## 2020-03-25 ENCOUNTER — Other Ambulatory Visit (HOSPITAL_COMMUNITY)
Admission: RE | Admit: 2020-03-25 | Discharge: 2020-03-25 | Disposition: A | Discharge status: Home / Self Care | Payer: 59 | Source: Ambulatory Visit | Attending: Internal Medicine | Admitting: Internal Medicine

## 2020-03-25 VITALS — BP 110/77 | HR 73 | Temp 96.5°F | Resp 16 | Ht 65.0 in | Wt 310.4 lb

## 2020-03-25 DIAGNOSIS — Z1159 Encounter for screening for other viral diseases: Secondary | ICD-10-CM

## 2020-03-25 DIAGNOSIS — M5442 Lumbago with sciatica, left side: Secondary | ICD-10-CM

## 2020-03-25 DIAGNOSIS — R200 Anesthesia of skin: Secondary | ICD-10-CM | POA: Diagnosis not present

## 2020-03-25 DIAGNOSIS — Z113 Encounter for screening for infections with a predominantly sexual mode of transmission: Secondary | ICD-10-CM

## 2020-03-25 DIAGNOSIS — Z0000 Encounter for general adult medical examination without abnormal findings: Secondary | ICD-10-CM

## 2020-03-25 NOTE — Progress Notes (Signed)
Subjective:    Patient ID: Destiny Lee, female    DOB: 1981/09/19, 39 y.o.   MRN: [id number]  DOS:  03/25/2020 Type of visit - description: CPX In general feeling well. Did report low back pain, worse on the left. From time to time gets numb at the lateral aspect of the left thigh.  Worse if she sits for prolonged periods of times and if she walks. No calf pain. Was prescribed meloxicam but does not take it regularly   Review of Systems  Other than above, a 14 point review of systems is negative     Past Medical History:  Diagnosis Date   Obesity     Past Surgical History:  Procedure Laterality Date   NO PAST SURGERIES      Allergies as of 03/25/2020   No Known Allergies     Medication List       Accurate as of March 25, 2020  9:18 PM. If you have any questions, ask your nurse or doctor.        fluticasone 50 MCG/ACT nasal spray Commonly known as: FLONASE Place 2 sprays into both nostrils daily.   levocetirizine 5 MG tablet Commonly known as: XYZAL Take 1 tablet (5 mg total) by mouth every evening.   levonorgestrel 20 MCG/24HR IUD Commonly known as: MIRENA 1 each by Intrauterine route once.   meloxicam 15 MG tablet Commonly known as: MOBIC Take 1 tablet (15 mg total) by mouth daily.   Ozempic (0.25 or 0.5 MG/DOSE) 2 MG/1.5ML Sopn Generic drug: Semaglutide(0.25 or 0.5MG /DOS) Inject 0.1875 mLs (0.25 mg total) into the skin once a week for 7 days, THEN 0.375 mLs (0.5 mg total) once a week for 28 days. Start taking on: March 25, 2020 What changed: See the new instructions. Changed by: Willow Ora, MD   SUMAtriptan 100 MG tablet Commonly known as: Imitrex Take 1 tablet (100 mg total) by mouth every 2 (two) hours as needed for migraine. May repeat in 2 hours if headache persists or recurs.          Objective:   Physical Exam Musculoskeletal:       Legs:    BP 110/77 (BP Location: Left Arm, Patient Position: Sitting, Cuff Size: Normal)    Pulse  73    Temp (!) 96.5 F (35.8 C) (Temporal)    Resp 16    Ht 5\' 5"  (1.651 m)    Wt (!) 310 lb 6 oz (140.8 kg)    SpO2 100%    BMI 51.65 kg/m  General: Well developed, NAD, BMI noted Neck: No  thyromegaly  HEENT:  Normocephalic . Face symmetric, atraumatic Lungs:  CTA B Normal respiratory effort, no intercostal retractions, no accessory muscle use. Heart: RRR,  no murmur.  Abdomen:  Not distended, soft, non-tender. No rebound or rigidity. MSK: Slightly TTP at the low back, mid and left side. Lower extremities: no pretibial edema bilaterally  Skin: Exposed areas without rash. Not pale. Not jaundice Neurologic:  alert & oriented X3.  Speech normal, gait appropriate for age and unassisted Strength symmetric and appropriate for age.  DTRs symmetric. Straight leg test negative Pinprick examination of legs normal Psych: Cognition and judgment appear intact.  Cooperative with normal attention span and concentration.  Behavior appropriate. No anxious or depressed appearing.     Assessment    39 year old female, PMH includes allergies, morbid obesity,migraines , IUD presents for a physical:  Physical exam: Tdap 2016 S/p pfizer covid shots x 2  rec flu shot q year Female care per gyn Diet exercise: Counseled, she actually is using Ozempic as prescribed by Dr. Carmelia Roller, trying to do better with diet, trying to walk more, has lost few pounds. Safe sex discussed Labs: CMP, FLP, CBC, TSH, hep C, HIV, RPR, urinary for gonorrhea, chlamydia and trichomonas.  Allergies: Controlled  Morbid obesity: See comments under physical exam.  Recommend to follow-up with Dr. Lacretia Nicks  Migraines: Not an issue at this point, takes Imitrex sporadically.  Back pain: Left-sided, some numbness at the lateral aspect of the left side, this could be a radiculopathy versus meralgia paresthetica .  We agreed on Ortho referral.  Next physical 1 year   This visit occurred during the SARS-CoV-2 public health  emergency.  Safety protocols were in place, including screening questions prior to the visit, additional usage of staff PPE, and extensive cleaning of exam room while observing appropriate contact time as indicated for disinfecting solutions.

## 2020-03-25 NOTE — Progress Notes (Signed)
Pre visit review using our clinic review tool, if applicable. No additional management support is needed unless otherwise documented below in the visit note. 

## 2020-03-25 NOTE — Patient Instructions (Signed)
We are referring you to orthopedic doctor  Please follow-up with Dr. Carmelia Roller regards Ozempic and weight loss  GO TO THE LAB : Get the blood work     GO TO THE FRONT DESK, PLEASE SCHEDULE YOUR APPOINTMENTS Come back for a physical exam in 1 year

## 2020-03-26 ENCOUNTER — Other Ambulatory Visit (INDEPENDENT_AMBULATORY_CARE_PROVIDER_SITE_OTHER): Payer: 59

## 2020-03-26 DIAGNOSIS — Z1159 Encounter for screening for other viral diseases: Secondary | ICD-10-CM | POA: Diagnosis not present

## 2020-03-26 DIAGNOSIS — Z0000 Encounter for general adult medical examination without abnormal findings: Secondary | ICD-10-CM | POA: Diagnosis not present

## 2020-03-26 DIAGNOSIS — Z113 Encounter for screening for infections with a predominantly sexual mode of transmission: Secondary | ICD-10-CM | POA: Diagnosis not present

## 2020-03-26 LAB — COMPREHENSIVE METABOLIC PANEL
ALT: 28 U/L (ref 0–35)
AST: 16 U/L (ref 0–37)
Albumin: 4.1 g/dL (ref 3.5–5.2)
Alkaline Phosphatase: 48 U/L (ref 39–117)
BUN: 12 mg/dL (ref 6–23)
CO2: 28 mEq/L (ref 19–32)
Calcium: 9 mg/dL (ref 8.4–10.5)
Chloride: 105 mEq/L (ref 96–112)
Creatinine, Ser: 0.68 mg/dL (ref 0.40–1.20)
GFR: 116.11 mL/min (ref >60.00)
Glucose, Bld: 80 mg/dL (ref 70–99)
Potassium: 4 mEq/L (ref 3.5–5.1)
Sodium: 140 mEq/L (ref 135–145)
Total Bilirubin: 0.4 mg/dL (ref 0.2–1.2)
Total Protein: 7 g/dL (ref 6.0–8.3)

## 2020-03-26 LAB — LIPID PANEL
Cholesterol: 147 mg/dL (ref 0–200)
HDL: 37.5 mg/dL — ABNORMAL LOW (ref >39.00)
LDL Cholesterol: 98 mg/dL (ref 0–99)
NonHDL: 109.84
Total CHOL/HDL Ratio: 4
Triglycerides: 58 mg/dL (ref 0.0–149.0)
VLDL: 11.6 mg/dL (ref 0.0–40.0)

## 2020-03-26 LAB — CBC WITH DIFFERENTIAL/PLATELET
Basophils Absolute: 0 10*3/uL (ref 0.0–0.1)
Basophils Relative: 0.3 % (ref 0.0–3.0)
Eosinophils Absolute: 0.1 10*3/uL (ref 0.0–0.7)
Eosinophils Relative: 1.4 % (ref 0.0–5.0)
HCT: 39.4 % (ref 36.0–46.0)
Hemoglobin: 13 g/dL (ref 12.0–15.0)
Lymphocytes Relative: 28.2 % (ref 12.0–46.0)
Lymphs Abs: 1.6 10*3/uL (ref 0.7–4.0)
MCHC: 33.2 g/dL (ref 30.0–36.0)
MCV: 95.3 fl (ref 78.0–100.0)
Monocytes Absolute: 0.5 10*3/uL (ref 0.1–1.0)
Monocytes Relative: 9.5 % (ref 3.0–12.0)
Neutro Abs: 3.5 10*3/uL (ref 1.4–7.7)
Neutrophils Relative %: 60.6 % (ref 43.0–77.0)
Platelets: 171 10*3/uL (ref 150.0–400.0)
RBC: 4.13 Mil/uL (ref 3.87–5.11)
RDW: 13.9 % (ref 11.5–15.5)
WBC: 5.7 10*3/uL (ref 4.0–10.5)

## 2020-03-26 LAB — TSH: TSH: 2.89 u[IU]/mL (ref 0.35–4.50)

## 2020-03-27 ENCOUNTER — Telehealth (INDEPENDENT_AMBULATORY_CARE_PROVIDER_SITE_OTHER): Payer: 59 | Admitting: Family Medicine

## 2020-03-27 ENCOUNTER — Encounter: Payer: Self-pay | Admitting: Family Medicine

## 2020-03-27 ENCOUNTER — Other Ambulatory Visit: Payer: Self-pay

## 2020-03-27 VITALS — Temp 96.9°F

## 2020-03-27 DIAGNOSIS — R0981 Nasal congestion: Secondary | ICD-10-CM

## 2020-03-27 LAB — HEPATITIS C ANTIBODY
Hepatitis C Ab: NONREACTIVE
SIGNAL TO CUT-OFF: 0.02 (ref <1.00)

## 2020-03-27 LAB — URINE CYTOLOGY ANCILLARY ONLY
Chlamydia: NEGATIVE
Comment: NEGATIVE
Comment: NEGATIVE
Comment: NORMAL
Neisseria Gonorrhea: NEGATIVE
Trichomonas: NEGATIVE

## 2020-03-27 LAB — HIV ANTIBODY (ROUTINE TESTING W REFLEX): HIV 1&2 Ab, 4th Generation: NONREACTIVE

## 2020-03-27 LAB — RPR: RPR Ser Ql: NONREACTIVE

## 2020-03-27 MED ORDER — PREDNISONE 20 MG PO TABS: 40.0000 mg | ORAL_TABLET | Freq: Every day | ORAL | 0 refills | Status: AC

## 2020-03-27 MED FILL — predniSONE 20 MG TABS: 20 | 5 days supply | Qty: 10 | Fill #0

## 2020-03-27 NOTE — Progress Notes (Signed)
Chief Complaint  Patient presents with  . Nasal Congestion    Destiny Lee here for URI complaints. Due to COVID-19 pandemic, we are interacting via web portal for an electronic face-to-face visit. I verified patient's ID using 2 identifiers. Patient agreed to proceed with visit via this method. Patient is at home, I am at office. Patient and I are present for visit.   Duration: 3 days  Associated symptoms: sinus congestion Denies: sinus pain, rhinorrhea, itchy watery eyes, ear pain, ear drainage, sore throat, wheezing, shortness of breath, myalgia and fevers Treatment to date: Xyzal, Flonase Sick contacts: No  Past Medical History:  Diagnosis Date  . Obesity     Temp (!) 96.9 F (36.1 C) (Temporal)  No conversational dyspnea Age appropriate judgment and insight Nml affect and mood  Nasal congestion - Plan: predniSONE (DELTASONE) 20 MG tablet  Likely allergic. Pred burst. Cont INCS. Let us know if things change.  F/u prn.  Pt voiced understanding and agreement to the plan.  [name] [place], DO 03/27/20 10:46 AM

## 2020-04-01 MED FILL — OZEMPIC 0.25 OR 0.5 MG/DOSE: 2 | 28 days supply | Qty: 2 | Fill #1

## 2020-04-29 ENCOUNTER — Other Ambulatory Visit: Payer: Self-pay | Admitting: Family Medicine

## 2020-04-29 ENCOUNTER — Other Ambulatory Visit: Payer: Self-pay

## 2020-04-29 DIAGNOSIS — J302 Other seasonal allergic rhinitis: Secondary | ICD-10-CM

## 2020-04-29 MED ORDER — FLUTICASONE PROPIONATE 50 MCG/ACT NA SUSP: 2.0000 | Freq: Every day | NASAL | 6 refills | Status: DC

## 2020-04-29 MED FILL — FLUTICASONE PROP 50 MCG SPR: 50 | 30 days supply | Qty: 16 | Fill #0

## 2020-05-14 MED FILL — OZEMPIC 0.25 OR 0.5 MG/DOSE: 2 | 28 days supply | Qty: 2 | Fill #2

## 2020-05-15 ENCOUNTER — Other Ambulatory Visit: Payer: Self-pay | Admitting: Family Medicine

## 2020-05-15 DIAGNOSIS — H60392 Other infective otitis externa, left ear: Secondary | ICD-10-CM

## 2020-05-15 MED ORDER — OFLOXACIN 0.3 % OT SOLN: 10.0000 [drp] | Freq: Every day | OTIC | 0 refills | Status: DC

## 2020-05-15 MED FILL — OFLOXACIN 0.3 % SOLN: 0.3 | 20 days supply | Qty: 10 | Fill #0

## 2020-05-18 ENCOUNTER — Other Ambulatory Visit: Payer: Self-pay | Admitting: Family Medicine

## 2020-05-18 MED ORDER — MONTELUKAST SODIUM 10 MG PO TABS: 10.0000 mg | ORAL_TABLET | Freq: Every day | ORAL | 3 refills | Status: DC

## 2020-05-18 MED FILL — MONTELUKAST SOD 10 MG TAB: 10 | 30 days supply | Qty: 30 | Fill #0

## 2020-05-20 ENCOUNTER — Other Ambulatory Visit: Payer: Self-pay | Admitting: Family Medicine

## 2020-05-20 MED ORDER — PREDNISONE 20 MG PO TABS
40.0000 mg | ORAL_TABLET | Freq: Every day | ORAL | 0 refills | Status: AC
Start: 2020-05-20 — End: 2020-05-25

## 2020-05-20 MED FILL — predniSONE 20 MG TABS: 20 | 5 days supply | Qty: 10 | Fill #0

## 2020-05-29 ENCOUNTER — Encounter: Payer: Self-pay | Admitting: Family Medicine

## 2020-05-29 ENCOUNTER — Ambulatory Visit: Payer: 59 | Admitting: Family Medicine

## 2020-05-29 ENCOUNTER — Other Ambulatory Visit: Payer: Self-pay

## 2020-05-29 VITALS — BP 122/78 | HR 87 | Temp 98.1°F | Ht 65.0 in | Wt 303.0 lb

## 2020-05-29 DIAGNOSIS — J3489 Other specified disorders of nose and nasal sinuses: Secondary | ICD-10-CM

## 2020-05-29 DIAGNOSIS — G44201 Tension-type headache, unspecified, intractable: Secondary | ICD-10-CM | POA: Diagnosis not present

## 2020-05-29 DIAGNOSIS — H6983 Other specified disorders of Eustachian tube, bilateral: Secondary | ICD-10-CM | POA: Diagnosis not present

## 2020-05-29 MED ORDER — METHYLPREDNISOLONE ACETATE 80 MG/ML IJ SUSP
80.0000 mg | Freq: Once | INTRAMUSCULAR | Status: AC
  Administered 2020-05-29: 80 mg via INTRAMUSCULAR

## 2020-05-29 NOTE — Patient Instructions (Addendum)
Don't get your second shot until 9/17.   Take Singulair and Xyzal along with Flonase.   If you do not hear anything about your referral in the next 1-2 weeks, call our office and ask for an update.  Heat (pad or rice pillow in microwave) over affected area, 10-15 minutes twice daily.   Ice/cold pack over area for 10-15 min twice daily.  OK to take Tylenol 1000 mg (2 extra strength tabs) or 975 mg (3 regular strength tabs) every 6 hours as needed.  Let us know if you need anything.  Trapezius stretches/exercises Do exercises exactly as told by your health care provider and adjust them as directed. It is normal to feel mild stretching, pulling, tightness, or discomfort as you do these exercises, but you should stop right away if you feel sudden pain or your pain gets worse.  Stretching and range of motion exercises These exercises warm up your muscles and joints and improve the movement and flexibility of your shoulder. These exercises can also help to relieve pain, numbness, and tingling. If you are unable to do any of the following for any reason, do not further attempt to do it.   Exercise A: Flexion, standing    1. Stand and hold a broomstick, a cane, or a similar object. Place your hands a little more than shoulder-width apart on the object. Your left / right hand should be palm-up, and your other hand should be palm-down. 2. Push the stick to raise your left / right arm out to your side and then over your head. Use your other hand to help move the stick. Stop when you feel a stretch in your shoulder, or when you reach the angle that is recommended by your health care provider. ? Avoid shrugging your shoulder while you raise your arm. Keep your shoulder blade tucked down toward your spine. 3. Hold for 30 seconds. 4. Slowly return to the starting position. Repeat 2 times. Complete this exercise 3 times per week.  Exercise B: Abduction, supine    1. Lie on your back and hold a  broomstick, a cane, or a similar object. Place your hands a little more than shoulder-width apart on the object. Your left / right hand should be palm-up, and your other hand should be palm-down. 2. Push the stick to raise your left / right arm out to your side and then over your head. Use your other hand to help move the stick. Stop when you feel a stretch in your shoulder, or when you reach the angle that is recommended by your health care provider. ? Avoid shrugging your shoulder while you raise your arm. Keep your shoulder blade tucked down toward your spine. 3. Hold for 30 seconds. 4. Slowly return to the starting position. Repeat 2 times. Complete this exercise 3 times per week.  Exercise C: Flexion, active-assisted    1. Lie on your back. You may bend your knees for comfort. 2. Hold a broomstick, a cane, or a similar object. Place your hands about shoulder-width apart on the object. Your palms should face toward your feet. 3. Raise the stick and move your arms over your head and behind your head, toward the floor. Use your healthy arm to help your left / right arm move farther. Stop when you feel a gentle stretch in your shoulder, or when you reach the angle where your health care provider tells you to stop. 4. Hold for 30 seconds. 5. Slowly return to the starting  position. Repeat 2 times. Complete this exercise 3 times per week.  Exercise D: External rotation and abduction    1. Stand in a door frame with one of your feet slightly in front of the other. This is called a staggered stance. 2. Choose one of the following positions as told by your health care provider: ? Place your hands and forearms on the door frame above your head. ? Place your hands and forearms on the door frame at the height of your head. ? Place your hands on the door frame at the height of your elbows. 3. Slowly move your weight onto your front foot until you feel a stretch across your chest and in the front of  your shoulders. Keep your head and chest upright and keep your abdominal muscles tight. 4. Hold for 30 seconds. 5. To release the stretch, shift your weight to your back foot. Repeat 2 times. Complete this stretch 3 times per week.  Strengthening exercises These exercises build strength and endurance in your shoulder. Endurance is the ability to use your muscles for a long time, even after your muscles get tired. Exercise E: Scapular depression and adduction  1. Sit on a stable chair. Support your arms in front of you with pillows, armrests, or a tabletop. Keep your elbows in line with the sides of your body. 2. Gently move your shoulder blades down toward your middle back. Relax the muscles on the tops of your shoulders and in the back of your neck. 3. Hold for 3 seconds. 4. Slowly release the tension and relax your muscles completely before doing this exercise again. Repeat for a total of 10 repetitions. 5. After you have practiced this exercise, try doing the exercise without the arm support. Then, try the exercise while standing instead of sitting. Repeat 2 times. Complete this exercise 3 times per week.  Exercise F: Shoulder abduction, isometric    1. Stand or sit about 4-6 inches (10-15 cm) from a wall with your left / right side facing the wall. 2. Bend your left / right elbow and gently press your elbow against the wall. 3. Increase the pressure slowly until you are pressing as hard as you can without shrugging your shoulder. 4. Hold for 3 seconds. 5. Slowly release the tension and relax your muscles completely. Repeat for a total of 10 repetitions. Repeat 2 times. Complete this exercise 3 times per week.  Exercise G: Shoulder flexion, isometric    1. Stand or sit about 4-6 inches (10-15 cm) away from a wall with your left / right side facing the wall. 2. Keep your left / right elbow straight and gently press the top of your fist against the wall. Increase the pressure slowly  until you are pressing as hard as you can without shrugging your shoulder. 3. Hold for 10-15 seconds. 4. Slowly release the tension and relax your muscles completely. Repeat for a total of 10 repetitions. Repeat 2 times. Complete this exercise 3 times per week.  Exercise H: Internal rotation    1. Sit in a stable chair without armrests, or stand. Secure an exercise band at your left / right side, at elbow height. 2. Place a soft object, such as a folded towel or a small pillow, under your left / right upper arm so your elbow is a few inches (about 8 cm) away from your side. 3. Hold the end of the exercise band so the band stretches. 4. Keeping your elbow pressed against the  soft object under your arm, move your forearm across your body toward your abdomen. Keep your body steady so the movement is only coming from your shoulder. 5. Hold for 3 seconds. 6. Slowly return to the starting position. Repeat for a total of 10 repetitions. Repeat 2 times. Complete this exercise 3 times per week.  Exercise I: External rotation    1. Sit in a stable chair without armrests, or stand. 2. Secure an exercise band at your left / right side, at elbow height. 3. Place a soft object, such as a folded towel or a small pillow, under your left / right upper arm so your elbow is a few inches (about 8 cm) away from your side. 4. Hold the end of the exercise band so the band stretches. 5. Keeping your elbow pressed against the soft object under your arm, move your forearm out, away from your abdomen. Keep your body steady so the movement is only coming from your shoulder. 6. Hold for 3 seconds. 7. Slowly return to the starting position. Repeat for a total of 10 repetitions. Repeat 2 times. Complete this exercise 3 times per week. Exercise J: Shoulder extension  1. Sit in a stable chair without armrests, or stand. Secure an exercise band to a stable object in front of you so the band is at shoulder  height. 2. Hold one end of the exercise band in each hand. Your palms should face each other. 3. Straighten your elbows and lift your hands up to shoulder height. 4. Step back, away from the secured end of the exercise band, until the band stretches. 5. Squeeze your shoulder blades together and pull your hands down to the sides of your thighs. Stop when your hands are straight down by your sides. Do not let your hands go behind your body. 6. Hold for 3 seconds. 7. Slowly return to the starting position. Repeat for a total of 10 repetitions. Repeat 2 times. Complete this exercise 3 times per week.  Exercise K: Shoulder extension, prone    1. Lie on your abdomen on a firm surface so your left / right arm hangs over the edge. 2. Hold a 5 lb weight in your hand so your palm faces in toward your body. Your arm should be straight. 3. Squeeze your shoulder blade down toward the middle of your back. 4. Slowly raise your arm behind you, up to the height of the surface that you are lying on. Keep your arm straight. 5. Hold for 3 seconds. 6. Slowly return to the starting position and relax your muscles. Repeat for a total of 10 repetitions. Repeat 2 times. Complete this exercise 3 times per week.   Exercise L: Horizontal abduction, prone  1. Lie on your abdomen on a firm surface so your left / right arm hangs over the edge. 2. Hold a 5 lb weight in your hand so your palm faces toward your feet. Your arm should be straight. 3. Squeeze your shoulder blade down toward the middle of your back. 4. Bend your elbow so your hand moves up, until your elbow is bent to an "L" shape (90 degrees). With your elbow bent, slowly move your forearm forward and up. Raise your hand up to the height of the surface that you are lying on. ? Your upper arm should not move, and your elbow should stay bent. ? At the top of the movement, your palm should face the floor. 5. Hold for 3 seconds. 6. Slowly return  to the starting  position and relax your muscles. Repeat for a total of 10 repetitions. Repeat 2 times. Complete this exercise 3 times per week.  Exercise M: Horizontal abduction, standing  1. Sit on a stable chair, or stand. 2. Secure an exercise band to a stable object in front of you so the band is at shoulder height. 3. Hold one end of the exercise band in each hand. 4. Straighten your elbows and lift your hands straight in front of you, up to shoulder height. Your palms should face down, toward the floor. 5. Step back, away from the secured end of the exercise band, until the band stretches. 6. Move your arms out to your sides, and keep your arms straight. 7. Hold for 3 seconds. 8. Slowly return to the starting position. Repeat for a total of 10 repetitions. Repeat 2 times. Complete this exercise 3 times per week.  Exercise N: Scapular retraction and elevation  1. Sit on a stable chair, or stand. 2. Secure an exercise band to a stable object in front of you so the band is at shoulder height. 3. Hold one end of the exercise band in each hand. Your palms should face each other. 4. Sit in a stable chair without armrests, or stand. 5. Step back, away from the secured end of the exercise band, until the band stretches. 6. Squeeze your shoulder blades together and lift your hands over your head. Keep your elbows straight. 7. Hold for 3 seconds. 8. Slowly return to the starting position. Repeat for a total of 10 repetitions. Repeat 2 times. Complete this exercise 3 times per week.  This information is not intended to replace advice given to you by your health care provider. Make sure you discuss any questions you have with your health care provider. Document Released: 09/12/2005 Document Revised: 05/19/2016 Document Reviewed: 07/30/2015 Elsevier Interactive Patient Education  2017 [employer].

## 2020-05-29 NOTE — Progress Notes (Signed)
Chief Complaint  Patient presents with  . Ear Pain  . Headache    Subjective: Patient is a 39 y.o. female here for b/l ear pain.  1 mo for both  Past Medical History:  Diagnosis Date  . Obesity     Objective: BP 122/78 (BP Location: Left Arm, Patient Position: Sitting, Cuff Size: Large)   Pulse 87   Temp 98.1 F (36.7 C) (Oral)   Ht 5\' 5"  (1.651 m)   Wt (!) 303 lb (137.4 kg)   SpO2 97%   BMI 50.42 kg/m  General: Awake, appears stated age HEENT: MMM, EOMi, ears neg b/l, nares patent w/o d/c Heart: RRR MSK: +TTP over b/l traps and mild ttp over b/l temporalis msc. No ttp over neck or jaw.  Neuro: DTR's equal and symmetric throughout, no clonus, no cerebellar signs.  Lungs: CTAB, no rales, wheezes or rhonchi. No accessory muscle use Psych: Age appropriate judgment and insight, normal affect and mood  Assessment and Plan: Dysfunction of both eustachian tubes  Sinus pressure  Morbid obesity (HCC) - Plan: Amb Referral to Bariatric Surgery  Intractable tension-type headache, unspecified chronicity pattern  1/2. She needs to take Xyzal with the Singulair and Flonase. Will add Astelin if still having issues. Consider allergy referral. Depo shot today, wait 2 weeks before getting covid vaccine 2/2.  3. Refer bariatric surg. Failed lifestyle mods, Ozempic, Saxenda.  4. I wonder if it is related to tight muscles in shoulder girdle. Stretches/exercises. Discussed waiting on improvement from allergies and if no improvement starting med for [facility]. F/u in 1 mo.  The patient voiced understanding and agreement to the plan.  [facility] [place], DO 05/29/20  11:33 AM

## 2020-06-16 MED FILL — LEVOCETIRIZINE 5 MG TABLET: 5 | 90 days supply | Qty: 90 | Fill #1

## 2020-06-16 MED FILL — MONTELUKAST SOD 10 MG TAB: 10 | 30 days supply | Qty: 30 | Fill #1

## 2020-06-16 MED FILL — FLUTICASONE PROP 50 MCG SPR: 50 | 30 days supply | Qty: 16 | Fill #1

## 2020-06-23 ENCOUNTER — Ambulatory Visit: Payer: 59 | Attending: Internal Medicine

## 2020-06-23 DIAGNOSIS — Z23 Encounter for immunization: Secondary | ICD-10-CM

## 2020-06-23 NOTE — Progress Notes (Signed)
   Covid-19 Vaccination Clinic  Name:  Destiny Lee    MRN: [id number] DOB: November 22, 1980  06/23/2020  Destiny Lee was observed post Covid-19 immunization for 15 minutes without incident. She was provided with Vaccine Information Sheet and instruction to access the V-Safe system.  Vaccinated by [facility]  Destiny Lee was instructed to call 911 with any severe reactions post vaccine: Marland Kitchen Difficulty breathing  . Swelling of face and throat  . A fast heartbeat  . A bad rash all over body  . Dizziness and weakness   Immunizations Administered    Name Date Dose VIS Date Route   Pfizer COVID-19 Vaccine 06/23/2020  1:11 PM 0.3 mL 11/20/2018 Intramuscular   Manufacturer: [employer], [employer]   Lot: [id number]   NDC: [id number]

## 2020-08-18 MED FILL — OZEMPIC 0.25 OR 0.5 MG/DOSE: 2 | 28 days supply | Qty: 2 | Fill #3

## 2020-08-18 MED FILL — FLUTICASONE PROP 50 MCG SPR: 50 | 30 days supply | Qty: 16 | Fill #2

## 2020-08-27 ENCOUNTER — Encounter: Payer: Self-pay | Admitting: Family Medicine

## 2020-08-27 ENCOUNTER — Other Ambulatory Visit: Payer: Self-pay

## 2020-08-27 ENCOUNTER — Ambulatory Visit: Payer: 59 | Admitting: Family Medicine

## 2020-08-27 VITALS — BP 126/78 | HR 53 | Resp 17 | Ht 65.0 in | Wt 300.0 lb

## 2020-08-27 DIAGNOSIS — F411 Generalized anxiety disorder: Secondary | ICD-10-CM | POA: Insufficient documentation

## 2020-08-27 DIAGNOSIS — F321 Major depressive disorder, single episode, moderate: Secondary | ICD-10-CM | POA: Diagnosis not present

## 2020-08-27 NOTE — Patient Instructions (Signed)
Aim to do some physical exertion for 150 minutes per week. This is typically divided into 5 days per week, 30 minutes per day. The activity should be enough to get your heart rate up. Anything is better than nothing if you have time constraints.  Please consider counseling. Contact [phone] to schedule an appointment or inquire about cost/insurance coverage.  Coping skills Choose 5 that work for you:  Take a deep breath  Count to 20  Read a book  Do a puzzle  Meditate  Bake  Sing  Knit  Garden  Pray  Go outside  Call a friend  Listen to music  Take a walk  Color  Send a note  Take a bath  Watch a movie  Be alone in a quiet place  Pet an animal  Visit a friend  Journal  Exercise  Stretch   [facility] [address] [place], [place] [address] [phone]  [facility] [address] [place], [place] [address] [phone]  [facility] [address] [place], [place] [address] [phone]  [facility] [address], [place], [place], [phone] [address], [place], [place], [phone]  [facility] [address] [place], [place] [address] [phone]  [facility] [address] [place], [place] [phone]  [facility] [address] [place], [place] [phone]  Call one of these offices sooner than later as it can take 2-3 months to get a new patient appointment.

## 2020-08-27 NOTE — Progress Notes (Signed)
Chief Complaint  Patient presents with  . Personal Concern    Subjective Destiny Lee is an 39 y.o. female who presents with anxiety/depression Symptoms began 2 months ago. Anxiety symptoms: difficulty concentrating, fatigue, insomnia, irritable, palpitations, racing thoughts. Depressive symptoms depressed mood, anhedonia, psychomotor agitation, fatigue, feelings of worthlessness/guilt, difficulty concentrating, hopelessness, impaired memory, disturbed sleep,. Social stressors include: Work She is currently being treated with None and has failed None. She is not following with a psychologist.  Past Medical History:  Diagnosis Date  . Obesity      Family History Family History  Problem Relation Age of Onset  . Breast cancer Maternal Aunt   . Colon cancer Neg Hx   . CAD Neg Hx   . Diabetes Neg Hx     Exam BP 126/78 (BP Location: Left Arm, Patient Position: Sitting, Cuff Size: Large)   Pulse (!) 53   Resp 17   Ht 5\' 5"  (1.651 m)   Wt 300 lb (136.1 kg)   SpO2 97%   BMI 49.92 kg/m  General:  well developed, well nourished, in no apparent distress Lungs:  normal respiratory effort without accessory muscle use Psych: well oriented with normal range of affect and age-appropriate judgement/insight  Assessment and Plan  GAD (generalized anxiety disorder)  Depression, major, single episode, moderate (HCC)  Counseled on adjunctive treatment with exercise/physical activity.  She declined any medication though with her headaches, I would consider Effexor XR 37.5 mg daily. Number for [facility] provided in AVS. F/u in 1 mo or prn. Patient voiced understanding and agreement to the plan.  [facility] [place], DO 08/27/20 8:14 AM

## 2020-09-01 ENCOUNTER — Telehealth: Payer: Self-pay | Admitting: Family Medicine

## 2020-09-01 NOTE — Telephone Encounter (Signed)
PCP completed//have faxed to [employer]//sent to scan//received fax confirmation

## 2020-09-01 NOTE — Telephone Encounter (Signed)
FMLA [employer] paperwork faxed into office  Put paperwork in [employer] bin

## 2020-09-10 ENCOUNTER — Telehealth: Payer: Self-pay | Admitting: Family Medicine

## 2020-09-10 NOTE — Telephone Encounter (Signed)
Form placed in [employer]' " signature basket"

## 2020-09-10 NOTE — Telephone Encounter (Signed)
Disability paperwork faxed into front office   Put into [employer] bin up front

## 2020-09-29 ENCOUNTER — Telehealth (INDEPENDENT_AMBULATORY_CARE_PROVIDER_SITE_OTHER): Payer: No Typology Code available for payment source | Admitting: Family Medicine

## 2020-09-29 ENCOUNTER — Encounter: Payer: Self-pay | Admitting: Family Medicine

## 2020-09-29 ENCOUNTER — Other Ambulatory Visit: Payer: Self-pay

## 2020-09-29 DIAGNOSIS — F324 Major depressive disorder, single episode, in partial remission: Secondary | ICD-10-CM

## 2020-09-29 DIAGNOSIS — F411 Generalized anxiety disorder: Secondary | ICD-10-CM | POA: Diagnosis not present

## 2020-09-29 NOTE — Progress Notes (Signed)
Chief Complaint  Patient presents with  . Follow-up    Subjective Destiny Lee presents for f/u anxiety/depression. Due to COVID-19 pandemic, we are interacting via telephone. I verified patient's ID using 2 identifiers. Patient agreed to proceed with visit via this method. Patient is at home, I am at office. Patient and I are present for visit.   Pt went on a 30 d leave and contemplated seeing a counselor vs medication, ended up not pursuing.  Reports doing very well since treatment. No thoughts of harming self or others. No self-medication with alcohol, prescription drugs or illicit drugs. Pt is not following with a counselor/psychologist.  Past Medical History:  Diagnosis Date  . Obesity    Allergies as of 09/29/2020   No Known Allergies     Medication List       Accurate as of September 29, 2020  8:48 AM. If you have any questions, ask your nurse or doctor.        fluticasone 50 MCG/ACT nasal spray Commonly known as: FLONASE Place 2 sprays into both nostrils daily.   levocetirizine 5 MG tablet Commonly known as: XYZAL Take 1 tablet (5 mg total) by mouth every evening.   levonorgestrel 20 MCG/24HR IUD Commonly known as: MIRENA 1 each by Intrauterine route once.   meloxicam 15 MG tablet Commonly known as: MOBIC Take 1 tablet (15 mg total) by mouth daily.   montelukast 10 MG tablet Commonly known as: SINGULAIR Take 1 tablet (10 mg total) by mouth at bedtime.   Ozempic (0.25 or 0.5 MG/DOSE) 2 MG/1.5ML Sopn Generic drug: Semaglutide(0.25 or 0.5MG /DOS) Inject 0.1875 mLs (0.25 mg total) into the skin once a week for 7 days, THEN 0.375 mLs (0.5 mg total) once a week for 28 days. Start taking on: March 25, 2020   SUMAtriptan 100 MG tablet Commonly known as: Imitrex Take 1 tablet (100 mg total) by mouth every 2 (two) hours as needed for migraine. May repeat in 2 hours if headache persists or recurs.      Exam No conversational dyspnea Age appropriate judgment and  insight Nml affect and mood  Assessment and Plan  GAD (generalized anxiety disorder)  Depression, major, single episode, in partial remission (HCC)  Will rx Effexor XR if she has a relapse after returning to work.  F/u in 6 mo for CPE or prn. Total time: 11 min The patient voiced understanding and agreement to the plan.  Destiny Lee [place], DO 09/29/20 8:48 AM

## 2020-10-23 ENCOUNTER — Other Ambulatory Visit: Payer: Self-pay

## 2020-10-23 ENCOUNTER — Other Ambulatory Visit: Payer: Self-pay | Admitting: Family Medicine

## 2020-10-23 DIAGNOSIS — J302 Other seasonal allergic rhinitis: Secondary | ICD-10-CM

## 2020-10-23 MED ORDER — FLUTICASONE PROPIONATE 50 MCG/ACT NA SUSP: 2.0000 | Freq: Every day | NASAL | 6 refills | Status: DC

## 2020-10-23 MED ORDER — HYDROXYZINE PAMOATE 25 MG PO CAPS: 25.0000 mg | ORAL_CAPSULE | Freq: Three times a day (TID) | ORAL | 2 refills | Status: DC | PRN

## 2020-10-23 MED ORDER — LEVOCETIRIZINE DIHYDROCHLORIDE 5 MG PO TABS: 5.0000 mg | ORAL_TABLET | Freq: Every evening | ORAL | 2 refills | Status: DC

## 2020-10-23 MED ORDER — MONTELUKAST SODIUM 10 MG PO TABS: 10.0000 mg | ORAL_TABLET | Freq: Every day | ORAL | 2 refills | Status: DC

## 2020-10-23 MED FILL — HYDROXYZINE PAM 25 MG CAP: 25 | 5 days supply | Qty: 30 | Fill #0

## 2020-10-23 MED FILL — LEVOCETIRIZINE 5 MG TABLET: 5 | 90 days supply | Qty: 90 | Fill #0

## 2020-10-23 MED FILL — MONTELUKAST SOD 10 MG TAB: 10 | 90 days supply | Qty: 90 | Fill #0

## 2020-10-23 MED FILL — FLUTICASONE PROP 50 MCG SPR: 50 | 30 days supply | Qty: 16 | Fill #0

## 2020-10-29 ENCOUNTER — Other Ambulatory Visit: Payer: Self-pay | Admitting: Family Medicine

## 2020-10-29 MED FILL — OZEMPIC 0.25 OR 0.5 MG/DOSE: 2 | 28 days supply | Qty: 2 | Fill #0

## 2020-11-27 MED FILL — OZEMPIC 0.25 OR 0.5 MG/DOSE: 2 | 28 days supply | Qty: 2 | Fill #1

## 2020-12-03 ENCOUNTER — Other Ambulatory Visit (HOSPITAL_BASED_OUTPATIENT_CLINIC_OR_DEPARTMENT_OTHER): Payer: Self-pay | Admitting: Dentistry

## 2020-12-03 MED FILL — IBUPROFEN 600 MG TABLET: 600 | 7 days supply | Qty: 20 | Fill #0

## 2020-12-03 MED FILL — AMOXICILLIN 875 MG TABS: 875 | 5 days supply | Qty: 10 | Fill #0

## 2020-12-03 MED FILL — FLUCONAZOLE 150 MG TABS: 150 | 1 days supply | Qty: 1 | Fill #0

## 2020-12-09 ENCOUNTER — Other Ambulatory Visit (HOSPITAL_BASED_OUTPATIENT_CLINIC_OR_DEPARTMENT_OTHER): Payer: Self-pay | Admitting: Dentistry

## 2020-12-26 ENCOUNTER — Other Ambulatory Visit (HOSPITAL_BASED_OUTPATIENT_CLINIC_OR_DEPARTMENT_OTHER): Payer: Self-pay

## 2021-01-01 ENCOUNTER — Other Ambulatory Visit (HOSPITAL_BASED_OUTPATIENT_CLINIC_OR_DEPARTMENT_OTHER): Payer: Self-pay

## 2021-01-01 MED FILL — Semaglutide Soln Pen-inj 0.25 or 0.5 MG/DOSE (2 MG/1.5ML): SUBCUTANEOUS | 84 days supply | Qty: 4.5 | Fill #0 | Status: CN

## 2021-01-01 MED FILL — Semaglutide Soln Pen-inj 0.25 or 0.5 MG/DOSE (2 MG/1.5ML): SUBCUTANEOUS | 84 days supply | Qty: 4.5 | Fill #0 | Status: AC

## 2021-01-04 ENCOUNTER — Other Ambulatory Visit (HOSPITAL_BASED_OUTPATIENT_CLINIC_OR_DEPARTMENT_OTHER): Payer: Self-pay

## 2021-02-02 ENCOUNTER — Telehealth: Payer: Self-pay | Admitting: Family Medicine

## 2021-02-02 NOTE — Telephone Encounter (Signed)
Is in your folder 

## 2021-02-02 NOTE — Telephone Encounter (Signed)
FMLA forms faxed into front office  Placed into [employer] folder

## 2021-02-09 NOTE — Telephone Encounter (Signed)
Completed form faxed back to [employer] at [phone]. Form sent for scanning.

## 2021-02-09 NOTE — Telephone Encounter (Signed)
Received fax confirmation

## 2021-02-19 ENCOUNTER — Other Ambulatory Visit: Payer: Self-pay

## 2021-02-19 ENCOUNTER — Other Ambulatory Visit (HOSPITAL_BASED_OUTPATIENT_CLINIC_OR_DEPARTMENT_OTHER): Payer: Self-pay

## 2021-02-19 ENCOUNTER — Encounter: Payer: Self-pay | Admitting: Family Medicine

## 2021-02-19 ENCOUNTER — Ambulatory Visit (INDEPENDENT_AMBULATORY_CARE_PROVIDER_SITE_OTHER): Payer: No Typology Code available for payment source | Admitting: Family Medicine

## 2021-02-19 VITALS — BP 120/78 | HR 88 | Temp 98.0°F | Ht 66.0 in | Wt 298.0 lb

## 2021-02-19 DIAGNOSIS — G43809 Other migraine, not intractable, without status migrainosus: Secondary | ICD-10-CM | POA: Diagnosis not present

## 2021-02-19 DIAGNOSIS — F321 Major depressive disorder, single episode, moderate: Secondary | ICD-10-CM

## 2021-02-19 DIAGNOSIS — Z0000 Encounter for general adult medical examination without abnormal findings: Secondary | ICD-10-CM | POA: Diagnosis not present

## 2021-02-19 LAB — CBC
HCT: 39.4 % (ref 36.0–46.0)
Hemoglobin: 13.3 g/dL (ref 12.0–15.0)
MCHC: 33.9 g/dL (ref 30.0–36.0)
MCV: 94.4 fl (ref 78.0–100.0)
Platelets: 193 10*3/uL (ref 150.0–400.0)
RBC: 4.17 Mil/uL (ref 3.87–5.11)
RDW: 13.7 % (ref 11.5–15.5)
WBC: 6.3 10*3/uL (ref 4.0–10.5)

## 2021-02-19 LAB — COMPREHENSIVE METABOLIC PANEL
ALT: 17 U/L (ref 0–35)
AST: 13 U/L (ref 0–37)
Albumin: 4 g/dL (ref 3.5–5.2)
Alkaline Phosphatase: 44 U/L (ref 39–117)
BUN: 12 mg/dL (ref 6–23)
CO2: 28 mEq/L (ref 19–32)
Calcium: 9.1 mg/dL (ref 8.4–10.5)
Chloride: 102 mEq/L (ref 96–112)
Creatinine, Ser: 0.66 mg/dL (ref 0.40–1.20)
GFR: 109.56 mL/min (ref >60.00)
Glucose, Bld: 71 mg/dL (ref 70–99)
Potassium: 3.9 mEq/L (ref 3.5–5.1)
Sodium: 137 mEq/L (ref 135–145)
Total Bilirubin: 0.5 mg/dL (ref 0.2–1.2)
Total Protein: 7 g/dL (ref 6.0–8.3)

## 2021-02-19 LAB — LIPID PANEL
Cholesterol: 178 mg/dL (ref 0–200)
HDL: 44.4 mg/dL (ref >39.00)
LDL Cholesterol: 118 mg/dL — ABNORMAL HIGH (ref 0–99)
NonHDL: 133.47
Total CHOL/HDL Ratio: 4
Triglycerides: 77 mg/dL (ref 0.0–149.0)
VLDL: 15.4 mg/dL (ref 0.0–40.0)

## 2021-02-19 MED ORDER — KETOROLAC TROMETHAMINE 60 MG/2ML IM SOLN
60.0000 mg | Freq: Once | INTRAMUSCULAR | Status: AC
  Administered 2021-02-19: 60 mg via INTRAMUSCULAR

## 2021-02-19 MED ORDER — RIZATRIPTAN BENZOATE 10 MG PO TABS
10.0000 mg | ORAL_TABLET | ORAL | 0 refills | Status: DC | PRN
  Filled 2021-02-19: qty 10, 20d supply, fill #0

## 2021-02-19 NOTE — Progress Notes (Signed)
Chief Complaint  Patient presents with  . Annual Exam     Well Woman Destiny Lee is here for a complete physical.   Her last physical was >1 year ago.  Current diet: in general, a "healthy" diet. Current exercise: walking. Weight is slowly decreasing intentionally and she denies fatigue out of ordinary. Seatbelt? Yes  Health Maintenance Pap/HPV- Yes Tetanus- Yes Hep C screening- Yes HIV screening- Yes  Past Medical History:  Diagnosis Date  . Obesity      Past Surgical History:  Procedure Laterality Date  . NO PAST SURGERIES      Medications  Current Outpatient Medications on File Prior to Visit  Medication Sig Dispense Refill  . chlorhexidine (PERIDEX) 0.12 % solution RINSE TWICE A DAY WITH 1/2 OUNCE AFTER BRUSHING. DO NOT EAT, DRINK, OR RINSE FOR 30 MINUTES AFTER 473 mL 12  . fluticasone (FLONASE) 50 MCG/ACT nasal spray PLACE 2 SPRAYS INTO BOTH NOSTRILS DAILY. 16 g 6  . hydrOXYzine (VISTARIL) 25 MG capsule TAKE 1 - 3 CAPSULES BY MOUTH 3 TIMES DAILY AS NEEDED FOR ANXIETY 30 capsule 2  . ibuprofen (ADVIL) 600 MG tablet TAKE 1 TABLET BY MOUTH EVERY 6 - 8 HOURS AS NEEDED 20 tablet 0  . levocetirizine (XYZAL) 5 MG tablet TAKE 1 TABLET (5 MG TOTAL) BY MOUTH EVERY EVENING. 90 tablet 2  . levonorgestrel (MIRENA) 20 MCG/24HR IUD 1 each by Intrauterine route once.    . montelukast (SINGULAIR) 10 MG tablet TAKE 1 TABLET (10 MG TOTAL) BY MOUTH AT BEDTIME. 90 tablet 2  . Semaglutide,0.25 or 0.5MG /DOS, 2 MG/1.5ML SOPN INJECT 0.5 MG INTO THE SKIN ONCE A WEEK. 9 mL 1  . SUMAtriptan (IMITREX) 100 MG tablet Take 1 tablet (100 mg total) by mouth every 2 (two) hours as needed for migraine. May repeat in 2 hours if headache persists or recurs. 10 tablet 5    Allergies No Known Allergies  Review of Systems: Constitutional:  no unexpected weight changes Eye:  no recent significant change in vision Ear/Nose/Mouth/Throat:  Ears:  no recent change in hearing Nose/Mouth/Throat:  no  complaints of nasal congestion, no sore throat Cardiovascular: no chest pain Respiratory:  no shortness of breath Gastrointestinal:  no abdominal pain, no change in bowel habits GU:  Female: negative for dysuria or pelvic pain Musculoskeletal/Extremities:  no pain of the joints Integumentary (Skin/Breast):  no abnormal skin lesions reported Neurologic: + headaches Endocrine:  denies fatigue Hematologic/Lymphatic:  No areas of easy bleeding  Exam BP 120/78 (BP Location: Left Arm, Patient Position: Sitting, Cuff Size: Large)   Pulse 88   Temp 98 F (36.7 C) (Oral)   Ht 5\' 6"  (1.676 m)   Wt 298 lb (135.2 kg)   SpO2 95%   BMI 48.10 kg/m  General:  well developed, well nourished, in no apparent distress Skin:  no significant moles, warts, or growths Head:  no masses, lesions, or tenderness Eyes:  pupils equal and round, sclera anicteric without injection Ears:  canals without lesions, TMs shiny without retraction, no obvious effusion, no erythema Nose:  nares patent, septum midline, mucosa normal, and no drainage or sinus tenderness Throat/Pharynx:  lips and gingiva without lesion; tongue and uvula midline; non-inflamed pharynx; no exudates or postnasal drainage Neck: neck supple without adenopathy, thyromegaly, or masses Lungs:  clear to auscultation, breath sounds equal bilaterally, no respiratory distress Cardio:  regular rate and rhythm, no LE edema Abdomen:  abdomen soft, nontender; bowel sounds normal; no masses or organomegaly Genital: Defer  to GYN Musculoskeletal:  symmetrical muscle groups noted without atrophy or deformity Extremities:  no clubbing, cyanosis, or edema, no deformities, no skin discoloration Neuro:  gait normal; deep tendon reflexes normal and symmetric Psych: well oriented with normal range of affect and appropriate judgment/insight  Assessment and Plan  Well adult exam - Plan: CBC, Comprehensive metabolic panel, Lipid panel  Depression, major, single  episode, moderate (HCC), Chronic  Morbid obesity (HCC), Chronic  Other migraine without status migrainosus, not intractable - Plan: rizatriptan (MAXALT) 10 MG tablet, ketorolac (TORADOL) injection 60 mg   Well 40 y.o. female. Counseled on diet and exercise. Other orders as above. Migraine: Chronic, uncontrolled. Toradol injection today. Change Imitrex to Maxalt. Will avoid preventative for now. She will let me know if things are not improving, will consider Nurtec.  Follow up in 6 mo or prn. The patient voiced understanding and agreement to the plan.  [name] [place], DO 02/19/21 7:54 AM

## 2021-02-19 NOTE — Patient Instructions (Signed)
Give us 2-3 business days to get the results of your labs back.   Keep the diet clean and stay active.  Let us know if you need anything. 

## 2021-02-24 ENCOUNTER — Other Ambulatory Visit (HOSPITAL_BASED_OUTPATIENT_CLINIC_OR_DEPARTMENT_OTHER): Payer: Self-pay

## 2021-02-26 ENCOUNTER — Other Ambulatory Visit (HOSPITAL_BASED_OUTPATIENT_CLINIC_OR_DEPARTMENT_OTHER): Payer: Self-pay

## 2021-03-08 ENCOUNTER — Ambulatory Visit: Payer: No Typology Code available for payment source | Attending: Internal Medicine

## 2021-03-08 DIAGNOSIS — Z23 Encounter for immunization: Secondary | ICD-10-CM

## 2021-03-08 NOTE — Progress Notes (Signed)
   Covid-19 Vaccination Clinic  Name:  Tiernan Millikin    MRN: [id number] DOB: Nov 28, 1980  03/08/2021  Ms. Reede was observed post Covid-19 immunization for 15 minutes without incident. She was provided with Vaccine Information Sheet and instruction to access the V-Safe system.   Ms. Pacha was instructed to call [phone] with any severe reactions post vaccine: Difficulty breathing  Swelling of face and throat  A fast heartbeat  A bad rash all over body  Dizziness and weakness   Immunizations Administered     Name Date Dose VIS Date Route   PFIZER Comrnaty(Gray TOP) Covid-19 Vaccine 03/08/2021 12:28 PM 0.3 mL 09/03/2020 Intramuscular   Manufacturer: [employer], [employer]   Lot: [id number]   NDC: [phone]

## 2021-03-09 ENCOUNTER — Other Ambulatory Visit: Payer: Self-pay

## 2021-03-09 ENCOUNTER — Other Ambulatory Visit (HOSPITAL_BASED_OUTPATIENT_CLINIC_OR_DEPARTMENT_OTHER): Payer: Self-pay

## 2021-03-09 DIAGNOSIS — R8290 Unspecified abnormal findings in urine: Secondary | ICD-10-CM

## 2021-03-09 MED ORDER — FLUCONAZOLE 150 MG PO TABS
150.0000 mg | ORAL_TABLET | Freq: Once | ORAL | 0 refills | Status: AC
Start: 2021-03-09 — End: 2021-03-10
  Filled 2021-03-09: qty 1, 1d supply, fill #0

## 2021-03-09 MED ORDER — COVID-19 MRNA VAC-TRIS(PFIZER) 30 MCG/0.3ML IM SUSP
INTRAMUSCULAR | 0 refills | Status: DC
  Filled 2021-03-09: qty 0.3, 1d supply, fill #0

## 2021-04-16 ENCOUNTER — Ambulatory Visit (INDEPENDENT_AMBULATORY_CARE_PROVIDER_SITE_OTHER): Payer: No Typology Code available for payment source | Admitting: Family Medicine

## 2021-04-16 ENCOUNTER — Other Ambulatory Visit (HOSPITAL_BASED_OUTPATIENT_CLINIC_OR_DEPARTMENT_OTHER): Payer: Self-pay

## 2021-04-16 ENCOUNTER — Other Ambulatory Visit: Payer: Self-pay

## 2021-04-16 ENCOUNTER — Encounter: Payer: Self-pay | Admitting: Family Medicine

## 2021-04-16 VITALS — BP 120/82 | HR 97 | Temp 97.9°F | Ht 65.0 in | Wt 300.0 lb

## 2021-04-16 DIAGNOSIS — M7918 Myalgia, other site: Secondary | ICD-10-CM

## 2021-04-16 DIAGNOSIS — M545 Low back pain, unspecified: Secondary | ICD-10-CM

## 2021-04-16 MED ORDER — MELOXICAM 15 MG PO TABS
15.0000 mg | ORAL_TABLET | Freq: Every day | ORAL | 0 refills | Status: DC
  Filled 2021-04-16: qty 30, 30d supply, fill #0

## 2021-04-16 MED ORDER — TRAMADOL HCL 50 MG PO TABS
50.0000 mg | ORAL_TABLET | Freq: Three times a day (TID) | ORAL | 0 refills | Status: AC | PRN
  Filled 2021-04-16: qty 15, 5d supply, fill #0

## 2021-04-16 NOTE — Patient Instructions (Signed)
Heat (pad or rice pillow in microwave) over affected area, 10-15 minutes twice daily.   Ice/cold pack over area for 10-15 min twice daily.  OK to take Tylenol 1000 mg (2 extra strength tabs) or 975 mg (3 regular strength tabs) every 6 hours as needed.  Do not drink alcohol, do any illicit/street drugs, drive or do anything that requires alertness while on this medicine.   Let us know if you need anything.  Gluteus Medius Syndrome Rehab It is normal to feel mild stretching, pulling, tightness, or discomfort as you do these exercises, but you should stop right away if you feel sudden pain or your pain gets worse.   Stretching and range of motion exercise This exercise warms up your muscles and joints and improves the movement and flexibility of your hip and pelvis. This exercise also helps to relieve pain and stiffness. Exercise A: Lunge (hip flexor stretch)      Kneel on the floor on your left / right knee. Bend your other knee so it is directly over your ankle. Keep good posture with your head over your shoulders. Tuck your tailbone underneath you. This will prevent your back from arching too much. You should feel a gentle stretch in the front of your thigh or hip. If you do not feel a stretch, slowly lunge forward with your chest up. Hold this position for 30 seconds. Slowly return to the starting position. Repeat 2 times. Complete this exercise 3 times per week. Strengthening exercises These exercises build strength and endurance in your hip and pelvis. Endurance is the ability to use your muscles for a long time, even after they get tired. Exercise B: Bridge (hip extensors)     Lie on your back on a firm surface with your knees bent and your feet flat on the floor. Tighten your buttocks muscles and lift your bottom off the floor until the trunk of your body is level with your thighs. You should feel the muscles working in your buttocks and the back of your thighs. If this exercise  is too easy, cross your arms over your chest or lift one leg while your bottom is up off the floor. Do not arch your back. Hold this position for 3 seconds. Slowly lower your hips to the starting position. Let your muscles relax completely between repetitions. Repeat 2 times. Complete this exercise 3 times per week. Exercise C: Straight leg raises (hip abductors)     Lie on your side with your left / right leg in the top position. Lie so your head, shoulder, knee, and hip line up. Bend your bottom knee to help you balance. Lift your top leg up 4-6 inches (10-15 cm), keeping your toes pointed straight ahead. Hold this position for 2 seconds. Slowly lower your leg to the starting position and let your muscles relax completely. Repeat for a total of 10 repetitions. Repeat 2 times. Complete this exercise 3 times per week. Exercise D: Hip abductors and external rotators, quadruped Get on your hands and knees on a firm, lightly padded surface. Your hands should be directly below your shoulders, and your knees should be directly below your hips. Lift your left / right knee out to the side. Keep your knee bent. Do not twist your body. Hold this position for 3 seconds. Slowly lower your leg. Repeat for a total of 10 repetitions.  Repeat 2 times. Complete this exercise 3 times per week. Exercise E: Single leg stand Stand near a counter or  door frame to hold onto as needed. It is helpful to look in a mirror for this exercise so you can watch your hip. Squeeze your left / right buttock muscles then lift up your other foot. Do not let your left / right hip push out to the side. Hold this position for 3 seconds. Repeat for a total of 10 repetitions. Repeat 2 times. Complete this exercise 3 times per week. Make sure you discuss any questions you have with your health care provider. Document Released: 09/12/2005 Document Revised: 05/19/2016 Document Reviewed: 08/25/2015 Elsevier Interactive Patient  Education  [employer].

## 2021-04-16 NOTE — Progress Notes (Signed)
Musculoskeletal Exam  Patient: Destiny Lee DOB: 07/06/1981  DOS: 04/16/2021  SUBJECTIVE:  Chief Complaint:   Destiny Lee is a 40 y.o.  female for evaluation and treatment of L tailbone pain.   Onset:  3 days ago. Fell on her L side at work when trying to sit down in a chair Location: L hip pain radiating down L leg Character:  aching, sharp, and shooting  Progression of issue:  is unchanged Associated symptoms: hurts when walking Denies: Redness, bruising, swelling Treatment: to date has been OTC NSAIDS, ice, heat, and acetaminophen.   Neurovascular symptoms: no  Past Medical History:  Diagnosis Date   Obesity     Objective: VITAL SIGNS: BP 120/82   Pulse 97   Temp 97.9 F (36.6 C) (Oral)   Ht 5\' 5"  (1.651 m)   Wt 300 lb (136.1 kg)   SpO2 99%   BMI 49.92 kg/m  Constitutional: Well formed, well developed. No acute distress. Thorax & Lungs: No accessory muscle use Musculoskeletal: Low back/buttock.   Tenderness to palpation: Yes over left SI joint and greater trochanter Deformity: no Ecchymosis: no Tests positive: none Tests negative: Straight leg, logroll, [place]inchfield Neurologic: Normal sensory function. No focal deficits noted. DTR's equal and symmetric in LE's. No clonus.  Gait is antalgic. Psychiatric: Normal mood. Age appropriate judgment and insight. Alert & oriented x 3.    Assessment:  Acute left-sided low back pain without sciatica - Plan: traMADol (ULTRAM) 50 MG tablet, meloxicam (MOBIC) 15 MG tablet  Gluteal pain - Plan: traMADol (ULTRAM) 50 MG tablet, meloxicam (MOBIC) 15 MG tablet  Plan: Stretches/exercises, heat, ice, Tylenol.  Meloxicam 50 mg daily.  Tramadol for breakthrough pain.  PT versus sports med referral if no improvement.  Doubt fracture. F/u as originally scheduled. The patient voiced understanding and agreement to the plan.   [place], DO 04/16/21  4:36 PM

## 2021-05-05 ENCOUNTER — Other Ambulatory Visit (HOSPITAL_BASED_OUTPATIENT_CLINIC_OR_DEPARTMENT_OTHER): Payer: Self-pay

## 2021-05-05 MED FILL — Fluticasone Propionate Nasal Susp 50 MCG/ACT: NASAL | 30 days supply | Qty: 16 | Fill #0 | Status: AC

## 2021-06-24 ENCOUNTER — Other Ambulatory Visit: Payer: Self-pay

## 2021-06-24 ENCOUNTER — Other Ambulatory Visit (HOSPITAL_BASED_OUTPATIENT_CLINIC_OR_DEPARTMENT_OTHER): Payer: Self-pay

## 2021-06-24 DIAGNOSIS — J302 Other seasonal allergic rhinitis: Secondary | ICD-10-CM

## 2021-06-24 MED ORDER — SEMAGLUTIDE(0.25 OR 0.5MG/DOS) 2 MG/1.5ML ~~LOC~~ SOPN
0.5000 mg | PEN_INJECTOR | SUBCUTANEOUS | 1 refills | Status: DC
  Filled 2021-06-24: qty 4.5, 84d supply, fill #0

## 2021-06-24 MED ORDER — LEVOCETIRIZINE DIHYDROCHLORIDE 5 MG PO TABS
ORAL_TABLET | Freq: Every evening | ORAL | 2 refills | Status: DC
  Filled 2021-06-24: qty 90, 90d supply, fill #0

## 2021-06-24 MED ORDER — MONTELUKAST SODIUM 10 MG PO TABS
ORAL_TABLET | Freq: Every day | ORAL | 2 refills | Status: DC
  Filled 2021-06-24: qty 90, 90d supply, fill #0

## 2021-06-24 MED ORDER — FLUTICASONE PROPIONATE 50 MCG/ACT NA SUSP
2.0000 | Freq: Every day | NASAL | 6 refills | Status: DC
  Filled 2021-06-24: qty 16, 30d supply, fill #0

## 2021-08-04 ENCOUNTER — Other Ambulatory Visit (HOSPITAL_COMMUNITY)
Admission: RE | Admit: 2021-08-04 | Discharge: 2021-08-04 | Disposition: A | Discharge status: Home / Self Care | Payer: No Typology Code available for payment source | Source: Ambulatory Visit | Attending: Obstetrics & Gynecology | Admitting: Obstetrics & Gynecology

## 2021-08-04 ENCOUNTER — Encounter: Payer: Self-pay | Admitting: Obstetrics & Gynecology

## 2021-08-04 ENCOUNTER — Ambulatory Visit (INDEPENDENT_AMBULATORY_CARE_PROVIDER_SITE_OTHER): Payer: No Typology Code available for payment source | Admitting: Obstetrics & Gynecology

## 2021-08-04 ENCOUNTER — Other Ambulatory Visit: Payer: Self-pay

## 2021-08-04 VITALS — BP 134/67 | HR 75 | Ht 65.0 in | Wt 319.0 lb

## 2021-08-04 DIAGNOSIS — Z01419 Encounter for gynecological examination (general) (routine) without abnormal findings: Secondary | ICD-10-CM

## 2021-08-04 DIAGNOSIS — Z1239 Encounter for other screening for malignant neoplasm of breast: Secondary | ICD-10-CM | POA: Diagnosis not present

## 2021-08-04 NOTE — Progress Notes (Signed)
Subjective:     Destiny Lee is a 40 y.o. female here for a routine exam.  Current complaints: no issues noted.   NO changes since last exam.    Gynecologic History No LMP recorded (exact date). (Menstrual status: IUD). Contraception: IUD Last Pap: 11/04/2019. Results were: normal Last mammogram: 11/29/2017 [facility]  Results were: normal  Obstetric History OB History  Gravida Para Term Preterm AB Living  2 1 1   1 1   SAB IAB Ectopic Multiple Live Births    1     1    # Outcome Date GA Lbr Len/2nd Weight Sex Delivery Anes PTL Lv  2 IAB 1999          1 Term 1999 [redacted]w[redacted]d   F Vag-Spont None N LIV   The following portions of the patient's history were reviewed and updated as appropriate: allergies, current medications, past family history, past medical history, past social history, past surgical history, and problem list.  Review of Systems Pertinent items are noted in HPI.    Objective:  BP 134/67   Pulse 75   Ht 5\' 5"  (1.651 m)   Wt (!) 319 lb (144.7 kg)   LMP  (Exact Date)   BMI 53.08 kg/m   General Appearance:    Alert, cooperative, no distress, appears stated age  Head:    Normocephalic, without obvious abnormality, atraumatic  Eyes:    conjunctiva/corneas clear, EOM's intact, both eyes  Ears:    Normal external ear canals, both ears  Nose:   Nares normal, septum midline, mucosa normal, no drainage    or sinus tenderness  Throat:   Lips, mucosa, and tongue normal; teeth and gums normal  Neck:   Supple, symmetrical, trachea midline, no adenopathy;    thyroid:  no enlargement/tenderness/nodules  Back:     Symmetric, no curvature, ROM normal, no CVA tenderness  Lungs:     respirations unlabored  Chest Wall:    No tenderness or deformity   Heart:    Regular rate and rhythm  Breast Exam:    No tenderness, masses, or nipple abnormality  Abdomen:     Soft, non-tender, bowel sounds active all four quadrants,    no masses, no organomegaly  Genitalia:    Normal female without  lesion, discharge or tenderness   IUD strings NOT seen  Extremities:   Extremities normal, atraumatic, no cyanosis or edema  Pulses:   2+ and symmetric all extremities  Skin:   Skin color, texture, turgor normal, no rashes or lesions     Assessment:    Healthy female exam.    Plan:   Diagnoses and all orders for this visit:  Well woman exam with routine gynecological exam -     Cytology - PAP( [facility])  Encounter for screening for malignant neoplasm of breast, unspecified screening modality -     MM Digital Screening; Future   F/u in 1 year or sooner prn   Jamar Casagrande L. Harraway-Smith, M.D., [redacted]w[redacted]d

## 2021-08-04 NOTE — Progress Notes (Signed)
Patient reports some spotting about two weeks ago but usually no bleeding with IUD. Armandina Stammer RN

## 2021-08-06 LAB — CYTOLOGY - PAP
Comment: NEGATIVE
Diagnosis: NEGATIVE
High risk HPV: NEGATIVE

## 2021-08-11 ENCOUNTER — Encounter: Payer: Self-pay | Admitting: Obstetrics & Gynecology

## 2021-09-13 ENCOUNTER — Ambulatory Visit (INDEPENDENT_AMBULATORY_CARE_PROVIDER_SITE_OTHER): Payer: No Typology Code available for payment source | Admitting: Family Medicine

## 2021-09-13 ENCOUNTER — Other Ambulatory Visit (HOSPITAL_BASED_OUTPATIENT_CLINIC_OR_DEPARTMENT_OTHER): Payer: Self-pay

## 2021-09-13 ENCOUNTER — Other Ambulatory Visit: Payer: Self-pay

## 2021-09-13 ENCOUNTER — Encounter: Payer: Self-pay | Admitting: Family Medicine

## 2021-09-13 ENCOUNTER — Ambulatory Visit (HOSPITAL_BASED_OUTPATIENT_CLINIC_OR_DEPARTMENT_OTHER)
Admission: RE | Admit: 2021-09-13 | Discharge: 2021-09-13 | Disposition: A | Discharge status: Home / Self Care | Payer: No Typology Code available for payment source | Source: Ambulatory Visit | Attending: Obstetrics & Gynecology | Admitting: Obstetrics & Gynecology

## 2021-09-13 ENCOUNTER — Encounter (HOSPITAL_BASED_OUTPATIENT_CLINIC_OR_DEPARTMENT_OTHER): Payer: Self-pay

## 2021-09-13 VITALS — BP 124/82 | HR 77 | Temp 97.9°F | Ht 65.0 in | Wt 312.0 lb

## 2021-09-13 DIAGNOSIS — Z1231 Encounter for screening mammogram for malignant neoplasm of breast: Secondary | ICD-10-CM | POA: Insufficient documentation

## 2021-09-13 DIAGNOSIS — T7840XA Allergy, unspecified, initial encounter: Secondary | ICD-10-CM | POA: Diagnosis not present

## 2021-09-13 DIAGNOSIS — Z1239 Encounter for other screening for malignant neoplasm of breast: Secondary | ICD-10-CM

## 2021-09-13 MED ORDER — PREDNISONE 20 MG PO TABS
40.0000 mg | ORAL_TABLET | Freq: Every day | ORAL | 0 refills | Status: AC
  Filled 2021-09-13: qty 10, 5d supply, fill #0

## 2021-09-13 MED ORDER — AZELASTINE HCL 0.1 % NA SOLN
2.0000 | Freq: Two times a day (BID) | NASAL | 12 refills | Status: DC
  Filled 2021-09-13: qty 30, 50d supply, fill #0

## 2021-09-13 NOTE — Patient Instructions (Addendum)
Continue the Xyzal, Singulair, and Flonase.   If things get worse, let me know.   Let us know if you need anything.

## 2021-09-13 NOTE — Progress Notes (Signed)
Chief Complaint  Patient presents with   Ear Pain    Neck pain Hoarseness     Destiny Lee here for URI complaints.  Duration: 3 weeks  Associated symptoms: sinus congestion, rhinorrhea, ear fullness, ear pain, sore throat, and coughing Denies: sinus pain, ear drainage, wheezing, shortness of breath, myalgia, and fevers Treatment to date: allergy meds Sick contacts: No  Past Medical History:  Diagnosis Date   Obesity     Objective BP 124/82    Pulse 77    Temp 97.9 F (36.6 C) (Oral)    Ht 5\' 5"  (1.651 m)    Wt (!) 312 lb (141.5 kg)    SpO2 99%    BMI 51.92 kg/m  General: Awake, alert, appears stated age HEENT: AT, [place], ears patent b/l and TM's slightly retracted b/l but otherwise neg, nares patent w/o discharge, pharynx pink and without exudates, MMM Neck: No masses or asymmetry Heart: RRR Lungs: CTAB, no accessory muscle use Abd: BS+, S, NT, ND Psych: Age appropriate judgment and insight, normal mood and affect  Allergy, initial encounter - Plan: azelastine (ASTELIN) 0.1 % nasal spray, predniSONE (DELTASONE) 20 MG tablet  Continue to push fluids, practice good hand hygiene, cover mouth when coughing. F/u prn. If starting to experience fevers, shaking, or shortness of breath, seek immediate care. Pt voiced understanding and agreement to the plan.  [place], DO 09/13/21 12:14 PM

## 2021-09-14 ENCOUNTER — Inpatient Hospital Stay (HOSPITAL_BASED_OUTPATIENT_CLINIC_OR_DEPARTMENT_OTHER): Admission: RE | Admit: 2021-09-14 | Payer: No Typology Code available for payment source | Source: Ambulatory Visit

## 2021-11-13 ENCOUNTER — Other Ambulatory Visit: Payer: Self-pay

## 2021-11-13 ENCOUNTER — Emergency Department (HOSPITAL_BASED_OUTPATIENT_CLINIC_OR_DEPARTMENT_OTHER)
Admission: EM | Admit: 2021-11-13 | Discharge: 2021-11-13 | Disposition: A | Discharge status: Home / Self Care | Payer: No Typology Code available for payment source | Attending: Emergency Medicine | Admitting: Emergency Medicine

## 2021-11-13 ENCOUNTER — Encounter (HOSPITAL_BASED_OUTPATIENT_CLINIC_OR_DEPARTMENT_OTHER): Payer: Self-pay

## 2021-11-13 DIAGNOSIS — E876 Hypokalemia: Secondary | ICD-10-CM | POA: Insufficient documentation

## 2021-11-13 DIAGNOSIS — R1013 Epigastric pain: Secondary | ICD-10-CM | POA: Diagnosis present

## 2021-11-13 DIAGNOSIS — A599 Trichomoniasis, unspecified: Secondary | ICD-10-CM | POA: Insufficient documentation

## 2021-11-13 DIAGNOSIS — R1084 Generalized abdominal pain: Secondary | ICD-10-CM

## 2021-11-13 LAB — COMPREHENSIVE METABOLIC PANEL
ALT: 25 U/L (ref 0–44)
AST: 18 U/L (ref 15–41)
Albumin: 3.5 g/dL (ref 3.5–5.0)
Alkaline Phosphatase: 46 U/L (ref 38–126)
Anion gap: 5 (ref 5–15)
BUN: 9 mg/dL (ref 6–20)
CO2: 26 mmol/L (ref 22–32)
Calcium: 7.7 mg/dL — ABNORMAL LOW (ref 8.9–10.3)
Chloride: 103 mmol/L (ref 98–111)
Creatinine, Ser: 0.69 mg/dL (ref 0.44–1.00)
GFR, Estimated: >60 mL/min (ref >60)
Glucose, Bld: 88 mg/dL (ref 70–99)
Potassium: 2.9 mmol/L — ABNORMAL LOW (ref 3.5–5.1)
Sodium: 134 mmol/L — ABNORMAL LOW (ref 135–145)
Total Bilirubin: 0.5 mg/dL (ref 0.3–1.2)
Total Protein: 7.5 g/dL (ref 6.5–8.1)

## 2021-11-13 LAB — CBC WITH DIFFERENTIAL/PLATELET
Abs Immature Granulocytes: 0.01 10*3/uL (ref 0.00–0.07)
Basophils Absolute: 0 10*3/uL (ref 0.0–0.1)
Basophils Relative: 0 %
Eosinophils Absolute: 0 10*3/uL (ref 0.0–0.5)
Eosinophils Relative: 0 %
HCT: 41 % (ref 36.0–46.0)
Hemoglobin: 13.7 g/dL (ref 12.0–15.0)
Immature Granulocytes: 0 %
Lymphocytes Relative: 17 %
Lymphs Abs: 0.9 10*3/uL (ref 0.7–4.0)
MCH: 31.2 pg (ref 26.0–34.0)
MCHC: 33.4 g/dL (ref 30.0–36.0)
MCV: 93.4 fL (ref 80.0–100.0)
Monocytes Absolute: 0.6 10*3/uL (ref 0.1–1.0)
Monocytes Relative: 11 %
Neutro Abs: 4 10*3/uL (ref 1.7–7.7)
Neutrophils Relative %: 72 %
Platelets: 170 10*3/uL (ref 150–400)
RBC: 4.39 MIL/uL (ref 3.87–5.11)
RDW: 12.8 % (ref 11.5–15.5)
WBC: 5.6 10*3/uL (ref 4.0–10.5)
nRBC: 0 % (ref 0.0–0.2)

## 2021-11-13 LAB — URINALYSIS, MICROSCOPIC (REFLEX)

## 2021-11-13 LAB — URINALYSIS, ROUTINE W REFLEX MICROSCOPIC
Bilirubin Urine: NEGATIVE
Glucose, UA: NEGATIVE mg/dL
Ketones, ur: 15 mg/dL — AB
Leukocytes,Ua: NEGATIVE
Nitrite: NEGATIVE
Protein, ur: NEGATIVE mg/dL
Specific Gravity, Urine: >=1.03 (ref 1.005–1.030)
pH: 5.5 (ref 5.0–8.0)

## 2021-11-13 LAB — LIPASE, BLOOD: Lipase: 33 U/L (ref 11–51)

## 2021-11-13 LAB — PREGNANCY, URINE: Preg Test, Ur: NEGATIVE

## 2021-11-13 MED ORDER — DIPHENHYDRAMINE HCL 50 MG/ML IJ SOLN
25.0000 mg | Freq: Once | INTRAMUSCULAR | Status: AC
Start: 2021-11-13 — End: 2021-11-13
  Administered 2021-11-13: 25 mg via INTRAVENOUS
  Filled 2021-11-13: qty 1

## 2021-11-13 MED ORDER — METRONIDAZOLE 500 MG PO TABS: 500.0000 mg | ORAL_TABLET | Freq: Two times a day (BID) | ORAL | 0 refills | Status: DC

## 2021-11-13 MED ORDER — LIDOCAINE VISCOUS HCL 2 % MT SOLN
15.0000 mL | Freq: Once | OROMUCOSAL | Status: AC
  Administered 2021-11-13: 15 mL via ORAL
  Filled 2021-11-13: qty 15

## 2021-11-13 MED ORDER — METOCLOPRAMIDE HCL 5 MG/ML IJ SOLN
10.0000 mg | Freq: Once | INTRAMUSCULAR | Status: AC
  Administered 2021-11-13: 10 mg via INTRAVENOUS
  Filled 2021-11-13: qty 2

## 2021-11-13 MED ORDER — KETOROLAC TROMETHAMINE 15 MG/ML IJ SOLN
15.0000 mg | Freq: Once | INTRAMUSCULAR | Status: AC
Start: 2021-11-13 — End: 2021-11-13
  Administered 2021-11-13: 15 mg via INTRAVENOUS
  Filled 2021-11-13: qty 1

## 2021-11-13 MED ORDER — ALUM & MAG HYDROXIDE-SIMETH 200-200-20 MG/5ML PO SUSP
30.0000 mL | Freq: Once | ORAL | Status: AC
  Administered 2021-11-13: 30 mL via ORAL
  Filled 2021-11-13: qty 30

## 2021-11-13 MED ORDER — POTASSIUM CHLORIDE CRYS ER 20 MEQ PO TBCR
40.0000 meq | EXTENDED_RELEASE_TABLET | Freq: Once | ORAL | Status: AC
Start: 2021-11-13 — End: 2021-11-13
  Administered 2021-11-13: 40 meq via ORAL
  Filled 2021-11-13: qty 2

## 2021-11-13 NOTE — ED Provider Notes (Signed)
[facility] EMERGENCY DEPARTMENT Provider Note   CSN: [id number] Arrival date & time: 11/13/21  3557     History  Chief Complaint  Patient presents with   Abdominal Pain    Destiny Lee is a 41 y.o. female who presents to the ED for evaluation of epigastric and LUQ pain that started 4 days ago.  Patient states that she felt that she pulled a muscle, however it is not improving since onset.  She states she was laughing excessively on Monday night which is why she thought she pulled a muscle.  When asked to describe her pain, she states "it feels like a pulling".  She did have some severe nausea yesterday and has had diminished appetite as well.  She is attempted Gas-X at home which seemed to worsen symptoms.  She denies vomiting, diarrhea, constipation.  She denies chest pain, shortness of breath, urinary symptoms and fevers.   Abdominal Pain     Home Medications Prior to Admission medications   Medication Sig Start Date End Date Taking? Authorizing Provider  azelastine (ASTELIN) 0.1 % nasal spray Place 2 sprays into both nostrils 2 (two) times daily. Use in each nostril as directed 09/13/21   Sharlene Dory, DO  COVID-19 mRNA Vac-TriS, Pfizer, SUSP injection Inject into the muscle. 03/08/21   Judyann Munson, MD  fluticasone (FLONASE) 50 MCG/ACT nasal spray PLACE 2 SPRAYS INTO BOTH NOSTRILS DAILY. 06/24/21 06/24/22  Sharlene Dory, DO  levocetirizine (XYZAL) 5 MG tablet TAKE 1 TABLET (5 MG TOTAL) BY MOUTH EVERY EVENING. 06/24/21 06/24/22  Sharlene Dory, DO  levonorgestrel (MIRENA) 20 MCG/24HR IUD 1 each by Intrauterine route once.    [provider]  meloxicam (MOBIC) 15 MG tablet Take 1 tablet (15 mg total) by mouth daily. 04/16/21   Sharlene Dory, DO  montelukast (SINGULAIR) 10 MG tablet TAKE 1 TABLET (10 MG TOTAL) BY MOUTH AT BEDTIME. 06/24/21 06/24/22  Wendling, [name], DO  rizatriptan (MAXALT) 10 MG tablet Take 1 tablet  (10 mg total) by mouth as needed for migraine. May repeat in 2 hours if needed 02/19/21   Sharlene Dory, DO  Semaglutide,0.25 or 0.5MG /DOS, 2 MG/1.5ML SOPN INJECT 0.5 MG INTO THE SKIN ONCE A WEEK. 06/24/21 06/24/22  Sharlene Dory, DO      Allergies    Patient has no known allergies.    Review of Systems   Review of Systems  Gastrointestinal:  Positive for abdominal pain.   Physical Exam Updated Vital Signs BP (!) 139/97 (BP Location: Left Wrist)    Pulse 74    Temp 98.1 F (36.7 C) (Oral)    Resp 17    Ht 5\' 5"  (1.651 m)    Wt (!) 136.5 kg    SpO2 99%    BMI 50.09 kg/m  Physical Exam Vitals and nursing note reviewed.  Constitutional:      General: She is not in acute distress.    Appearance: She is not ill-appearing.  HENT:     Head: Atraumatic.  Eyes:     Conjunctiva/sclera: Conjunctivae normal.  Cardiovascular:     Rate and Rhythm: Normal rate and regular rhythm.     Pulses: Normal pulses.     Heart sounds: No murmur heard. Pulmonary:     Effort: Pulmonary effort is normal. No respiratory distress.     Breath sounds: Normal breath sounds.  Abdominal:     General: Abdomen is protuberant. There is no distension.  Palpations: Abdomen is soft.     Tenderness: There is abdominal tenderness in the epigastric area and left upper quadrant.     Comments: Very mild tenderness to palpation of the epigastric region into the LUQ.  Negative CVA tenderness bilaterally, negative Murphy sign, negative McBurney.  No tenderness palpation of the pelvis or LLQ.  Musculoskeletal:        General: Normal range of motion.     Cervical back: Normal range of motion.  Skin:    General: Skin is warm and dry.     Capillary Refill: Capillary refill takes less than 2 seconds.  Neurological:     General: No focal deficit present.     Mental Status: She is alert.  Psychiatric:        Mood and Affect: Mood normal.    ED Results / Procedures / Treatments   Labs (all labs ordered  are listed, but only abnormal results are displayed) Labs Reviewed  COMPREHENSIVE METABOLIC PANEL  LIPASE, BLOOD  CBC WITH DIFFERENTIAL/PLATELET  URINALYSIS, ROUTINE W REFLEX MICROSCOPIC  PREGNANCY, URINE    EKG EKG Interpretation  Date/Time:  Saturday November 13 2021 10:11:05 EST Ventricular Rate:  76 PR Interval:  178 QRS Duration: 96 QT Interval:  395 QTC Calculation: 445 R Axis:   82 Text Interpretation: Sinus rhythm Normal ECG No old tracing to compare Confirmed by Susy Frizzle [phone]) on 11/13/2021 10:29:32 AM  Radiology No results found.  Procedures Procedures    Medications Ordered in ED Medications  metoCLOPramide (REGLAN) injection 10 mg (has no administration in time range)  ketorolac (TORADOL) 15 MG/ML injection 15 mg (has no administration in time range)  diphenhydrAMINE (BENADRYL) injection 25 mg (has no administration in time range)  alum & mag hydroxide-simeth (MAALOX/MYLANTA) 200-200-20 MG/5ML suspension 30 mL (30 mLs Oral Given 11/13/21 1035)    And  lidocaine (XYLOCAINE) 2 % viscous mouth solution 15 mL (15 mLs Oral Given 11/13/21 1035)    ED Course/ Medical Decision Making/ A&P                           Medical Decision Making Amount and/or Complexity of Data Reviewed Labs: ordered.  Risk OTC drugs. Prescription drug management.   History:  Per HPI  Initial impression:  This patient presents to the ED for concern of abdominal pain, this involves an extensive number of treatment options, and is a complaint that carries with it a high risk of complications and morbidity.   The differential diagnosis for generalized abdominal pain includes, but is not limited to AAA, gastroenteritis, appendicitis, Bowel obstruction, Bowel perforation. Gastroparesis, DKA, Hernia, Inflammatory bowel disease, mesenteric ischemia, pancreatitis, peritonitis SBP, volvulus.   ED Course: 41 year old female, in no apparent distress, nontoxic-appearing.  Vitals are  stable.  Physical exam benign aside from some mild epigastric and LUQ tenderness.  Will obtain abdominal labs and reevaluate. Mild take hypokalemia at 2.9, CBC without leukocytosis.  UA without signs of infection.  Trichomonas noted on UA.  Pregnancy test negative.  Lipase normal. I gave patient po potassium to improve potassium levels. GI cocktails ordered.  Pt reports severe headache - migraine cocktail ordered with resolution.  I Ordered, reviewed, and interpreted labs and EKG.    Medicines ordered and prescription drug management:  I ordered medication including: GI cocktail Toradal 15mg  IV, Reglan 10mg  IV and benadryl 25mg  IV with improvement   Reevaluation of the patient after these medicines showed that the patient improved  I have reviewed the patients home medicines and have made adjustments as needed  Disposition:  After consideration of the diagnostic results, physical exam, history and the patients response to treatment feel that the patent would benefit from discharge with outpatient followup.   Generalized abdominal pain Trichomonas Hypokalemia:  No acute findings to suggest cause of her epigastric pain.  GI referral provided.  Pain did somewhat improve with medications today.  Incidentally noticed patient was hypokalemic.  Given that she is describing what appears to be a pulling/cramping sensation, it is possible that she has some muscle cramping secondary to hypokalemia.  Provided 40 mill equivalents potassium.  Also incidentally, trichomonas present on UA.  Sent in prescription of Flagyl.  She is to follow-up outpatient with her PCP to ensure resolution of her infection.  All questions were asked and answered.  Patient was discharged home in good condition.  Final Clinical Impression(s) / ED Diagnoses Final diagnoses:  Generalized abdominal pain  Trichimoniasis  Hypokalemia    Rx / DC Orders ED Discharge Orders          Ordered    metroNIDAZOLE (FLAGYL) 500 MG tablet   2 times daily        11/13/21 1214              Janell Quiet, [place] 11/14/21 1021    Pollyann Savoy, MD 11/14/21 [phone]

## 2021-11-13 NOTE — Discharge Instructions (Addendum)
Your work-up today was overall very reassuring and does not show a definite cause for your abdominal pain.  Your potassium was a bit low, and sometimes low potassium can cause sensations of muscle cramping or spasms.  If given you a pill here that should help with that.  Incidentally, we also saw that you have trichomonas on your urine sample.  I have sent you in a prescription for this to clear.  It is possible that you strained a muscle in your abdomen when you will had your laughing fit the day prior, and sometimes this can take longer than 4 to 5 days to fully resolve.  I would do gentle stretching and alternate taking Tylenol Motrin.  If your pain does not improve, please follow-up with your PCP.

## 2021-11-13 NOTE — ED Triage Notes (Signed)
Pt arrives ambulatory to ED with c/ pain to LUQ starting Tuesday with radiation into her back and RUQ. Pt reports some nausea yesterday, denies any vomiting.

## 2021-12-01 ENCOUNTER — Telehealth (INDEPENDENT_AMBULATORY_CARE_PROVIDER_SITE_OTHER): Payer: No Typology Code available for payment source | Admitting: Family Medicine

## 2021-12-01 ENCOUNTER — Encounter: Payer: Self-pay | Admitting: Family Medicine

## 2021-12-01 DIAGNOSIS — R109 Unspecified abdominal pain: Secondary | ICD-10-CM

## 2021-12-01 DIAGNOSIS — K9049 Malabsorption due to intolerance, not elsewhere classified: Secondary | ICD-10-CM

## 2021-12-01 DIAGNOSIS — K219 Gastro-esophageal reflux disease without esophagitis: Secondary | ICD-10-CM | POA: Diagnosis not present

## 2021-12-01 MED ORDER — DICYCLOMINE HCL 10 MG PO CAPS: ORAL_CAPSULE | ORAL | 0 refills | Status: DC

## 2021-12-01 MED ORDER — PANTOPRAZOLE SODIUM 40 MG PO TBEC: 40.0000 mg | DELAYED_RELEASE_TABLET | Freq: Every day | ORAL | 3 refills | Status: DC

## 2021-12-01 MED ORDER — FLUCONAZOLE 150 MG PO TABS: ORAL_TABLET | ORAL | 0 refills | Status: DC

## 2021-12-01 NOTE — Progress Notes (Signed)
Chief Complaint  ?Patient presents with  ? Abdominal Pain  ?  ER a couple weeks ago and still having problems. ?Pain and diarrhea off and on since Saturday night. ?  ? ? ?Destiny Lee is here for abdominal pain. Due to COVID-19 pandemic, we are interacting via web portal for an electronic face-to-face visit. I verified patient's ID using 2 identifiers. Patient agreed to proceed with visit via this method. Patient is at home, I am at office. Patient and I are present for visit.  ? ?Duration: 3 weeks ?Nighttime awakenings? No ?Bleeding? No ?Weight loss? No ?Palliation: PPI, Pepcid ?Provocation: dairy, acidic foods ?Associated symptoms:  diarrhea, cramping ?Denies: fever, nausea, and vomiting ?Treatment to date: Pepcid, Imodium  ? ?Past Medical History:  ?Diagnosis Date  ? Obesity   ? ?Objective ?No conversational dyspnea ?Age appropriate judgment and insight ?Nml affect and mood ? ? ?Gastroesophageal reflux disease, unspecified whether esophagitis present - Plan: pantoprazole (PROTONIX) 40 MG tablet ? ?Abdominal cramping - Plan: dicyclomine (BENTYL) 10 MG capsule ? ?Food intolerance - Plan: dicyclomine (BENTYL) 10 MG capsule ? ?Chronic, not controlled. Start Protonix 40 mg/d, Pepcid prn. Reflux precautions discussed. Needs to avoid fatty/greasy/acidic foods that trigger her.  ?Bentyl prn. Could be IBS but would want to eliminate food triggers first.  ?F/u in 2.5 mo for CPE and reck. ?Pt voiced understanding and agreement to the plan. ? ?Sharlene Dory, DO ?12/01/21 ?11:59 AM ? ? ?

## 2022-02-23 ENCOUNTER — Other Ambulatory Visit: Payer: Self-pay | Admitting: Family Medicine

## 2022-02-23 ENCOUNTER — Other Ambulatory Visit (HOSPITAL_BASED_OUTPATIENT_CLINIC_OR_DEPARTMENT_OTHER): Payer: Self-pay

## 2022-02-23 ENCOUNTER — Telehealth: Payer: Self-pay | Admitting: Family Medicine

## 2022-02-23 DIAGNOSIS — J302 Other seasonal allergic rhinitis: Secondary | ICD-10-CM

## 2022-02-23 MED ORDER — FLUTICASONE PROPIONATE 50 MCG/ACT NA SUSP
2.0000 | Freq: Every day | NASAL | 1 refills | Status: DC
  Filled 2022-02-23 – 2022-06-30 (×2): qty 16, 30d supply, fill #0

## 2022-02-23 MED ORDER — SEMAGLUTIDE(0.25 OR 0.5MG/DOS) 2 MG/3ML ~~LOC~~ SOPN
0.5000 mg | PEN_INJECTOR | SUBCUTANEOUS | 1 refills | Status: DC
  Filled 2022-02-23: qty 9, 84d supply, fill #0

## 2022-02-23 MED ORDER — MONTELUKAST SODIUM 10 MG PO TABS
ORAL_TABLET | Freq: Every day | ORAL | 2 refills | Status: DC
  Filled 2022-02-23 – 2022-06-30 (×2): qty 90, 90d supply, fill #0

## 2022-02-23 MED ORDER — LEVOCETIRIZINE DIHYDROCHLORIDE 5 MG PO TABS
ORAL_TABLET | Freq: Every evening | ORAL | 2 refills | Status: DC
  Filled 2022-02-23 – 2022-06-30 (×2): qty 90, 90d supply, fill #0

## 2022-02-23 NOTE — Telephone Encounter (Signed)
Intiated PA for Ozempic KEY:  [id number] Waiting response from Covermymeds.

## 2022-02-24 ENCOUNTER — Other Ambulatory Visit (HOSPITAL_BASED_OUTPATIENT_CLINIC_OR_DEPARTMENT_OTHER): Payer: Self-pay

## 2022-02-24 NOTE — Telephone Encounter (Signed)
Resubmitted PA  New KEY:  [id number] Initiate PA for Ozempic with new KEY Waiting response from Cover My meds.

## 2022-02-25 ENCOUNTER — Other Ambulatory Visit (HOSPITAL_BASED_OUTPATIENT_CLINIC_OR_DEPARTMENT_OTHER): Payer: Self-pay

## 2022-02-28 ENCOUNTER — Other Ambulatory Visit (HOSPITAL_BASED_OUTPATIENT_CLINIC_OR_DEPARTMENT_OTHER): Payer: Self-pay

## 2022-02-28 NOTE — Telephone Encounter (Signed)
Received Denial for this medication. Have not yet received information as to reason.

## 2022-02-28 NOTE — Telephone Encounter (Signed)
Denial reason was requires that the patient has a diagnosis of type 2 diabetes mellitus. Denied because condition does not meet requirement approval

## 2022-02-28 NOTE — Telephone Encounter (Signed)
Called informed the patient of denial/she will call back to get her CPE scheduled.

## 2022-03-01 ENCOUNTER — Other Ambulatory Visit (HOSPITAL_BASED_OUTPATIENT_CLINIC_OR_DEPARTMENT_OTHER): Payer: Self-pay

## 2022-03-02 ENCOUNTER — Other Ambulatory Visit (HOSPITAL_BASED_OUTPATIENT_CLINIC_OR_DEPARTMENT_OTHER): Payer: Self-pay

## 2022-03-02 NOTE — Telephone Encounter (Signed)
Spoke with patient about denial.  She is willing to try [employer].  Her ins will cover after PA at $150 (which she can use coupon to cover and get it at no cost).

## 2022-03-03 ENCOUNTER — Other Ambulatory Visit (HOSPITAL_BASED_OUTPATIENT_CLINIC_OR_DEPARTMENT_OTHER): Payer: Self-pay

## 2022-03-04 ENCOUNTER — Other Ambulatory Visit (HOSPITAL_BASED_OUTPATIENT_CLINIC_OR_DEPARTMENT_OTHER): Payer: Self-pay

## 2022-03-07 ENCOUNTER — Other Ambulatory Visit (HOSPITAL_BASED_OUTPATIENT_CLINIC_OR_DEPARTMENT_OTHER): Payer: Self-pay

## 2022-03-07 ENCOUNTER — Ambulatory Visit (INDEPENDENT_AMBULATORY_CARE_PROVIDER_SITE_OTHER): Payer: No Typology Code available for payment source | Admitting: Family Medicine

## 2022-03-07 ENCOUNTER — Telehealth: Payer: Self-pay | Admitting: *Deleted

## 2022-03-07 ENCOUNTER — Encounter: Payer: Self-pay | Admitting: Family Medicine

## 2022-03-07 VITALS — BP 114/76 | HR 77 | Temp 97.7°F | Ht 66.0 in | Wt 325.2 lb

## 2022-03-07 DIAGNOSIS — F321 Major depressive disorder, single episode, moderate: Secondary | ICD-10-CM | POA: Diagnosis not present

## 2022-03-07 DIAGNOSIS — M79672 Pain in left foot: Secondary | ICD-10-CM | POA: Diagnosis not present

## 2022-03-07 DIAGNOSIS — M79671 Pain in right foot: Secondary | ICD-10-CM | POA: Diagnosis not present

## 2022-03-07 DIAGNOSIS — Z0000 Encounter for general adult medical examination without abnormal findings: Secondary | ICD-10-CM

## 2022-03-07 MED ORDER — SEMAGLUTIDE-WEIGHT MANAGEMENT 0.5 MG/0.5ML ~~LOC~~ SOAJ
0.5000 mg | SUBCUTANEOUS | 0 refills | Status: DC
  Filled 2022-03-07 – 2022-09-23 (×3): qty 2, 28d supply, fill #0

## 2022-03-07 MED ORDER — SEMAGLUTIDE-WEIGHT MANAGEMENT 0.25 MG/0.5ML ~~LOC~~ SOAJ
0.2500 mg | SUBCUTANEOUS | 0 refills | Status: AC
  Filled 2022-03-07: qty 2, 28d supply, fill #0

## 2022-03-07 MED ORDER — SEMAGLUTIDE-WEIGHT MANAGEMENT 1 MG/0.5ML ~~LOC~~ SOAJ
1.0000 mg | SUBCUTANEOUS | 0 refills | Status: AC
  Filled 2022-03-07 – 2022-06-14 (×2): qty 2, 28d supply, fill #0

## 2022-03-07 MED ORDER — SEMAGLUTIDE-WEIGHT MANAGEMENT 1.7 MG/0.75ML ~~LOC~~ SOAJ
1.7000 mg | SUBCUTANEOUS | 0 refills | Status: AC
  Filled 2022-03-07: qty 3, 28d supply, fill #0

## 2022-03-07 MED ORDER — SEMAGLUTIDE-WEIGHT MANAGEMENT 2.4 MG/0.75ML ~~LOC~~ SOAJ
2.4000 mg | SUBCUTANEOUS | 0 refills | Status: AC
  Filled 2022-03-07: qty 3, 28d supply, fill #0

## 2022-03-07 NOTE — Progress Notes (Signed)
Chief Complaint  Patient presents with   Annual Exam     Well Woman Destiny Lee is here for a complete physical.   Her last physical was >1 year ago.  Current diet: in general, diet could be better. Current exercise: walking. Weight is increasing and she denies fatigue out of ordinary. Seatbelt? Yes Advanced directive? No  Health Maintenance Pap/HPV- Yes Mammogram- Yes Tetanus- Yes Hep C screening- Yes HIV screening- Yes  Morbid obesity Patient is gained around 15 pounds since her last visit several months ago.  She stopped taking semaglutide 0.5 mg weekly as it was giving her some constipation.  She stopped around 1 month ago.  Diet and exercise as above.  She is interested in bariatric surgery but is nervous about out-of-pocket cost.  Past Medical History:  Diagnosis Date   Obesity      Past Surgical History:  Procedure Laterality Date   NO PAST SURGERIES      Medications  Current Outpatient Medications on File Prior to Visit  Medication Sig Dispense Refill   azelastine (ASTELIN) 0.1 % nasal spray Place 2 sprays into both nostrils 2 (two) times daily. Use in each nostril as directed 30 mL 12   dicyclomine (BENTYL) 10 MG capsule Take 1 tab every 6 hours as needed for abdominal cramping. 60 capsule 0   fluticasone (FLONASE) 50 MCG/ACT nasal spray PLACE 2 SPRAYS INTO BOTH NOSTRILS DAILY. 16 g 1   levocetirizine (XYZAL) 5 MG tablet TAKE 1 TABLET (5 MG TOTAL) BY MOUTH EVERY EVENING. 90 tablet 2   levonorgestrel (MIRENA) 20 MCG/24HR IUD 1 each by Intrauterine route once.     metroNIDAZOLE (FLAGYL) 500 MG tablet Take 1 tablet (500 mg total) by mouth 2 (two) times daily. 14 tablet 0   montelukast (SINGULAIR) 10 MG tablet TAKE 1 TABLET (10 MG TOTAL) BY MOUTH AT BEDTIME. 90 tablet 2   rizatriptan (MAXALT) 10 MG tablet Take 1 tablet (10 mg total) by mouth as needed for migraine. May repeat in 2 hours if needed 10 tablet 0   Semaglutide,0.25 or 0.5MG /DOS, 2 MG/3ML SOPN Inject  0.5 mg into the skin once a week. 9 mL 1    Allergies No Known Allergies  Review of Systems: Constitutional:  no unexpected weight changes Eye:  no recent significant change in vision Ear/Nose/Mouth/Throat:  Ears:  no recent change in hearing Nose/Mouth/Throat:  no complaints of nasal congestion, no sore throat Cardiovascular: no chest pain Respiratory:  no shortness of breath Gastrointestinal:  no abdominal pain, no change in bowel habits GU:  Female: negative for dysuria or pelvic pain Musculoskeletal/Extremities:  + Bilateral foot pain (chronic though worsening) Integumentary (Skin/Breast):  no abnormal skin lesions reported Neurologic:  no headaches Endocrine:  denies fatigue Hematologic/Lymphatic:  No areas of easy bleeding  Exam BP 114/76   Pulse 77   Temp 97.7 F (36.5 C) (Oral)   Ht 5\' 6"  (1.676 m)   Wt (!) 325 lb 4 oz (147.5 kg)   SpO2 95%   BMI 52.50 kg/m  General:  well developed, well nourished, in no apparent distress Skin:  no significant moles, warts, or growths Head:  no masses, lesions, or tenderness Eyes:  pupils equal and round, sclera anicteric without injection Ears:  canals without lesions, TMs shiny without retraction, no obvious effusion, no erythema Nose:  nares patent, septum midline, mucosa normal, and no drainage or sinus tenderness Throat/Pharynx:  lips and gingiva without lesion; tongue and uvula midline; non-inflamed pharynx; no exudates or  postnasal drainage Neck: neck supple without adenopathy, thyromegaly, or masses Lungs:  clear to auscultation, breath sounds equal bilaterally, no respiratory distress Cardio:  regular rate and rhythm, no LE edema Abdomen:  abdomen soft, nontender; bowel sounds normal; no masses or organomegaly Genital: Defer to GYN Musculoskeletal:  symmetrical muscle groups noted without atrophy or deformity Extremities:  no clubbing, cyanosis, or edema, no deformities, no skin discoloration Neuro:  gait normal; deep  tendon reflexes normal and symmetric Psych: well oriented with normal range of affect and appropriate judgment/insight  Assessment and Plan  Well adult exam - Plan: CBC, Comprehensive metabolic panel, Lipid panel  Depression, major, single episode, moderate (HCC), Chronic  Morbid obesity ([place]), Chronic - Plan: Amb Referral to Bariatric Surgery, Semaglutide-Weight Management 0.25 MG/0.5ML SOAJ, Semaglutide-Weight Management 0.5 MG/0.5ML SOAJ, Semaglutide-Weight Management 1 MG/0.5ML SOAJ, Semaglutide-Weight Management 1.7 MG/0.75ML SOAJ, Semaglutide-Weight Management 2.4 MG/0.75ML SOAJ  Bilateral foot pain - Plan: Ambulatory referral to Podiatry   Well 41 y.o. female. Counseled on diet and exercise. Other orders as above. Morbid obesity: Chronic, uncontrolled.  She will increase her water intake and consider a stool softener.  We will restart Wegovy at 0.25 mg weekly for 4 doses then steadily increased at the highest tolerated dosage.  Refer to bariatric surgery today. Advanced directive form provided today.  Follow up in 6 mo or prn. The patient voiced understanding and agreement to the plan.  [place], DO 03/07/22 2:30 PM

## 2022-03-07 NOTE — Patient Instructions (Addendum)
Give us 2-3 business days to get the results of your labs back.   Keep the diet clean and stay active.  Please get me a copy of your advanced directive form at your convenience.   If you do not hear anything about your referrals in the next 1-2 weeks, call our office and ask for an update.  Let us know if you need anything.  

## 2022-03-07 NOTE — Telephone Encounter (Signed)
Prior auth started on Wegovy 0.25mg  by calling insurance.  Insurance was unable to give PA number [place].

## 2022-03-08 ENCOUNTER — Other Ambulatory Visit (HOSPITAL_BASED_OUTPATIENT_CLINIC_OR_DEPARTMENT_OTHER): Payer: Self-pay

## 2022-03-09 ENCOUNTER — Other Ambulatory Visit (HOSPITAL_BASED_OUTPATIENT_CLINIC_OR_DEPARTMENT_OTHER): Payer: Self-pay

## 2022-03-09 ENCOUNTER — Other Ambulatory Visit (INDEPENDENT_AMBULATORY_CARE_PROVIDER_SITE_OTHER): Payer: No Typology Code available for payment source

## 2022-03-09 DIAGNOSIS — Z0000 Encounter for general adult medical examination without abnormal findings: Secondary | ICD-10-CM

## 2022-03-09 NOTE — Telephone Encounter (Signed)
Received questionaire from insurance.  Filled out and faxed back.

## 2022-03-10 ENCOUNTER — Other Ambulatory Visit (HOSPITAL_BASED_OUTPATIENT_CLINIC_OR_DEPARTMENT_OTHER): Payer: Self-pay

## 2022-03-10 LAB — COMPREHENSIVE METABOLIC PANEL
ALT: 18 U/L (ref 0–35)
AST: 11 U/L (ref 0–37)
Albumin: 4.1 g/dL (ref 3.5–5.2)
Alkaline Phosphatase: 47 U/L (ref 39–117)
BUN: 12 mg/dL (ref 6–23)
CO2: 28 mEq/L (ref 19–32)
Calcium: 8.8 mg/dL (ref 8.4–10.5)
Chloride: 104 mEq/L (ref 96–112)
Creatinine, Ser: 0.71 mg/dL (ref 0.40–1.20)
GFR: 105.41 mL/min (ref >60.00)
Glucose, Bld: 81 mg/dL (ref 70–99)
Potassium: 3.9 mEq/L (ref 3.5–5.1)
Sodium: 139 mEq/L (ref 135–145)
Total Bilirubin: 0.4 mg/dL (ref 0.2–1.2)
Total Protein: 7.2 g/dL (ref 6.0–8.3)

## 2022-03-10 LAB — LIPID PANEL
Cholesterol: 201 mg/dL — ABNORMAL HIGH (ref 0–200)
HDL: 51.7 mg/dL (ref >39.00)
LDL Cholesterol: 134 mg/dL — ABNORMAL HIGH (ref 0–99)
NonHDL: 148.97
Total CHOL/HDL Ratio: 4
Triglycerides: 77 mg/dL (ref 0.0–149.0)
VLDL: 15.4 mg/dL (ref 0.0–40.0)

## 2022-03-10 LAB — CBC
HCT: 38.8 % (ref 36.0–46.0)
Hemoglobin: 12.8 g/dL (ref 12.0–15.0)
MCHC: 32.9 g/dL (ref 30.0–36.0)
MCV: 95.8 fl (ref 78.0–100.0)
Platelets: 192 10*3/uL (ref 150.0–400.0)
RBC: 4.05 Mil/uL (ref 3.87–5.11)
RDW: 13.8 % (ref 11.5–15.5)
WBC: 5.1 10*3/uL (ref 4.0–10.5)

## 2022-03-11 ENCOUNTER — Other Ambulatory Visit (HOSPITAL_BASED_OUTPATIENT_CLINIC_OR_DEPARTMENT_OTHER): Payer: Self-pay

## 2022-03-11 NOTE — Telephone Encounter (Signed)
Received Approval form [employer]. The authorization is effective for a maximum of 8 fills from 03/10/2022 to 09/29/2022, As long as the patient is enrolled as a member of her current health plan. The request was approved as submitted.

## 2022-03-11 NOTE — Telephone Encounter (Signed)
The patient was notified of approval

## 2022-03-21 ENCOUNTER — Other Ambulatory Visit (HOSPITAL_BASED_OUTPATIENT_CLINIC_OR_DEPARTMENT_OTHER): Payer: Self-pay

## 2022-03-22 ENCOUNTER — Ambulatory Visit (INDEPENDENT_AMBULATORY_CARE_PROVIDER_SITE_OTHER): Payer: No Typology Code available for payment source

## 2022-03-22 ENCOUNTER — Other Ambulatory Visit (HOSPITAL_BASED_OUTPATIENT_CLINIC_OR_DEPARTMENT_OTHER): Payer: Self-pay

## 2022-03-22 ENCOUNTER — Ambulatory Visit (INDEPENDENT_AMBULATORY_CARE_PROVIDER_SITE_OTHER): Payer: No Typology Code available for payment source | Admitting: Podiatry

## 2022-03-22 ENCOUNTER — Encounter: Payer: Self-pay | Admitting: Podiatry

## 2022-03-22 DIAGNOSIS — M722 Plantar fascial fibromatosis: Secondary | ICD-10-CM

## 2022-03-22 MED ORDER — MELOXICAM 15 MG PO TABS
15.0000 mg | ORAL_TABLET | Freq: Every day | ORAL | 0 refills | Status: DC
  Filled 2022-03-22: qty 30, 30d supply, fill #0

## 2022-03-31 ENCOUNTER — Other Ambulatory Visit (HOSPITAL_BASED_OUTPATIENT_CLINIC_OR_DEPARTMENT_OTHER): Payer: Self-pay

## 2022-04-01 ENCOUNTER — Other Ambulatory Visit (HOSPITAL_BASED_OUTPATIENT_CLINIC_OR_DEPARTMENT_OTHER): Payer: Self-pay

## 2022-04-04 ENCOUNTER — Other Ambulatory Visit (HOSPITAL_BASED_OUTPATIENT_CLINIC_OR_DEPARTMENT_OTHER): Payer: Self-pay

## 2022-04-05 ENCOUNTER — Other Ambulatory Visit (HOSPITAL_BASED_OUTPATIENT_CLINIC_OR_DEPARTMENT_OTHER): Payer: Self-pay

## 2022-05-03 ENCOUNTER — Encounter: Payer: Self-pay | Admitting: Podiatry

## 2022-05-03 ENCOUNTER — Ambulatory Visit (INDEPENDENT_AMBULATORY_CARE_PROVIDER_SITE_OTHER): Payer: No Typology Code available for payment source | Admitting: Podiatry

## 2022-05-03 DIAGNOSIS — M722 Plantar fascial fibromatosis: Secondary | ICD-10-CM | POA: Diagnosis not present

## 2022-05-03 MED ORDER — DEXAMETHASONE SODIUM PHOSPHATE 120 MG/30ML IJ SOLN
4.0000 mg | Freq: Once | INTRAMUSCULAR | Status: AC
  Administered 2022-05-03: 4 mg via INTRA_ARTICULAR

## 2022-05-03 NOTE — Progress Notes (Signed)
  Subjective:  Patient ID: Destiny Lee, female    DOB: Jul 20, 1981,   MRN: [id number]  Chief Complaint  Patient presents with   Plantar Fasciitis    Patient states foot pain is worst since the last visit , patient states she has been wearing the inserts. Constant pain that does not go away. Pain is mostly in the heel are , right heel is worst than the left. When in pain nothing helps but relaxing , patient states she has tried icing , stretching , rolling a ball.    41 y.o. female presents for  follow-up of bilateral plantar fasciitis. Relates it is doing worse since last visit. She has been stretching and icing but nothing helping and the inserts have been painful. She has seen Dr. Samuella Cota in the past for neuroma pain. Relates numbness along the bottom of both feet for about 3 years. Relates throbbing and aches. Denies any treatments recently.   . Denies any other pedal complaints. Denies n/v/f/c.   Past Medical History:  Diagnosis Date   Obesity     Objective:  Physical Exam: Vascular: DP/PT pulses 2/4 bilateral. CFT <3 seconds. Normal hair growth on digits. No edema.  Skin. No lacerations or abrasions bilateral feet.  Musculoskeletal: MMT 5/5 bilateral lower extremities in DF, PF, Inversion and Eversion. Deceased ROM in DF of ankle joint.  Tneder to medial calcaneal tubercle bilateral. No pain along arch some pain in the ball of the foot. No pain along achilles or PT tendon or with calcaneal squeeze. Tender with DF of the ankle.  Neurological: Sensation intact to light touch.   Assessment:   1. Plantar fasciitis, bilateral       Plan:  Patient was evaluated and treated and all questions answered. Discussed plantar fasciitis with patient.  X-rays reviewed and discussed with patient. No acute fractures or dislocations noted. Mild spurring noted at inferior calcaneus.  Discussed treatment options including, ice, NSAIDS, supportive shoes, bracing, and stretching.  Continue  stretching.  Continue with the inserts as tolerated.  Discussed injection and elected to proceed. Procedure below.  Amb ref to PT Follow-up 8 weeks or sooner if any problems arise. In the meantime, encouraged to call the office with any questions, concerns, change in symptoms.   Louann Sjogren, DPM

## 2022-05-17 ENCOUNTER — Encounter: Payer: Self-pay | Admitting: Physical Therapy

## 2022-05-17 ENCOUNTER — Ambulatory Visit: Payer: No Typology Code available for payment source | Attending: Podiatry | Admitting: Physical Therapy

## 2022-05-17 ENCOUNTER — Other Ambulatory Visit (HOSPITAL_BASED_OUTPATIENT_CLINIC_OR_DEPARTMENT_OTHER): Payer: Self-pay

## 2022-05-17 DIAGNOSIS — R262 Difficulty in walking, not elsewhere classified: Secondary | ICD-10-CM | POA: Diagnosis present

## 2022-05-17 DIAGNOSIS — M25672 Stiffness of left ankle, not elsewhere classified: Secondary | ICD-10-CM | POA: Insufficient documentation

## 2022-05-17 DIAGNOSIS — M25671 Stiffness of right ankle, not elsewhere classified: Secondary | ICD-10-CM | POA: Diagnosis present

## 2022-05-17 DIAGNOSIS — M79672 Pain in left foot: Secondary | ICD-10-CM | POA: Diagnosis present

## 2022-05-17 DIAGNOSIS — M6281 Muscle weakness (generalized): Secondary | ICD-10-CM | POA: Insufficient documentation

## 2022-05-17 DIAGNOSIS — M722 Plantar fascial fibromatosis: Secondary | ICD-10-CM | POA: Insufficient documentation

## 2022-05-17 DIAGNOSIS — M79671 Pain in right foot: Secondary | ICD-10-CM | POA: Insufficient documentation

## 2022-05-17 NOTE — Therapy (Signed)
OUTPATIENT PHYSICAL THERAPY LOWER EXTREMITY EVALUATION   Patient Name: Destiny Lee MRN: [id number] DOB:[birth year], 41 y.o., female Today's Date: 05/17/2022   PT End of Session - 05/17/22 0841     Visit Number 1    Number of Visits 7    Date for PT Re-Evaluation 06/28/22    Authorization Type [facility]    Authorization Time Period 05/17/22 to 06/28/22    PT Start Time 0806   arrived a few minutes late   PT Stop Time 0845    PT Time Calculation (min) 39 min    Activity Tolerance Patient tolerated treatment well    Behavior During Therapy [facility] for tasks assessed/performed             Past Medical History:  Diagnosis Date   Obesity    Past Surgical History:  Procedure Laterality Date   NO PAST SURGERIES     Patient Active Problem List   Diagnosis Date Noted   GAD (generalized anxiety disorder) 08/27/2020   Depression, major, single episode, moderate (HCC) 08/27/2020   Seasonal allergic rhinitis 01/31/2020   Sacroiliac dysfunction 08/07/2019   Morbid obesity (HCC) 08/07/2019   Pityriasis rosea-like skin eruption 07/11/2016    PCP: Sharlene Dory   REFERRING PROVIDER: Louann Sjogren, DPM   REFERRING DIAG: M72.2 (ICD-10-CM) - Plantar fasciitis, bilateral   THERAPY DIAG:  Pain in left foot - Plan: PT plan of care cert/re-cert  Pain in right foot - Plan: PT plan of care cert/re-cert  Stiffness of left ankle, not elsewhere classified - Plan: PT plan of care cert/re-cert  Stiffness of right ankle, not elsewhere classified - Plan: PT plan of care cert/re-cert  Difficulty in walking, not elsewhere classified - Plan: PT plan of care cert/re-cert  Muscle weakness (generalized) - Plan: PT plan of care cert/re-cert  Rationale for Evaluation and Treatment Rehabilitation  ONSET DATE: 05/03/2022   SUBJECTIVE:   SUBJECTIVE STATEMENT: My feet started bothering me about 2 years ago, then the MD told me I had OA in my feet and they stopped with cortisone  shots. This year it got bad again but different kind of pain, we tried the cortisone shots again but it didn't help. Had neuroma in the past I guess. Have tried icing, stretching, and inserts but they are making my toes numb.   PERTINENT HISTORY: 41 y.o. female presents for  follow-up of bilateral plantar fasciitis. Relates it is doing worse since last visit. She has been stretching and icing but nothing helping and the inserts have been painful. She has seen Dr. Samuella Cota in the past for neuroma pain. Relates numbness along the bottom of both feet for about 3 years. Relates throbbing and aches. Denies any treatments recently.   . Denies any other pedal complaints. Denies n/v/f/c.   PAIN:  Are you having pain? Yes: NPRS scale: 8-9/10 in R foot, L foot 6/10 Pain location: B feet  Pain description: burning, numbness, shooting pains Aggravating factors: standing/walking  Relieving factors: soaking in hot water   PRECAUTIONS: None  WEIGHT BEARING RESTRICTIONS No  FALLS:  Has patient fallen in last 6 months? 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   PLOF: Independent, Independent with basic ADLs, Independent with gait, and Independent with transfers  PATIENT GOALS reduce pain, proper stretching techniques for my feet and legs  OBJECTIVE:   DIAGNOSTIC FINDINGS:   PATIENT SURVEYS:  LEFS 36/80  COGNITION:  Overall cognitive status: Within functional limits for tasks assessed     SENSATION: Not tested  EDEMA:    MUSCLE LENGTH:  HS WNL L, mild limitation R  Piriformis mild limitation L, moderate limitation R   POSTURE: noted B arch drop in stance   PALPATION: B gastrocs tight and TTP, pain greater R>L at plantar fascia mm insertion R foot, forefeet both tender to touch, plantar fascia muscles  tender B with R>L   LOWER EXTREMITY ROM:  Active ROM Right eval Left eval  Hip flexion    Hip extension    Hip abduction    Hip adduction    Hip internal rotation    Hip external rotation    Knee flexion    Knee extension    Ankle dorsiflexion -4 -2  Ankle plantarflexion 53 57  Ankle inversion 36 40  Ankle eversion 5 2   (Blank rows = not tested)  LOWER EXTREMITY MMT:  MMT Right eval Left eval  Hip flexion 4+ 4+  Hip extension 4 4  Hip abduction 4 4  Hip adduction    Hip internal rotation    Hip external rotation    Knee flexion 3+ 3  Knee extension 5 5  Ankle dorsiflexion 5 5  Ankle plantarflexion    Ankle inversion 5 5  Ankle eversion 5 5   (Blank rows = not tested)  LOWER EXTREMITY SPECIAL TESTS:    FUNCTIONAL TESTS:    GAIT: Distance walked: antalgic, B arch drop, wide BOS, trendelenburg  Assistive device utilized: None Level of assistance: Complete Independence Comments:     TODAY'S TREATMENT: Nustep L3 x6 minutes BLEs only   Ankle circles CW/CCW x10 B  Ankle alphabet x1 B  Gastroc stretch on door frame x30 seconds B    PATIENT EDUCATION:  Education details: exam findings, POC, HEP  Person educated: Patient Education method: Explanation Education comprehension: verbalized understanding and returned demonstration   HOME EXERCISE PROGRAM: [id number]  ASSESSMENT:  CLINICAL IMPRESSION: Patient is a 41 y.o. F who was seen today for physical therapy evaluation and treatment for B plantar fasciitis. Exam is typical for this diagnosis with findings as expected. Will benefit from skilled PT services to address functional impairments and reduce pain moving forward.    OBJECTIVE IMPAIRMENTS Abnormal gait, decreased mobility, difficulty walking, decreased ROM, decreased strength, hypomobility, increased edema, increased fascial restrictions, increased muscle spasms, impaired flexibility, postural dysfunction, obesity, and pain.   ACTIVITY  LIMITATIONS standing, squatting, stairs, transfers, and locomotion level  PARTICIPATION LIMITATIONS: shopping, community activity, occupation, and yard work  PERSONAL FACTORS Age, Behavior pattern, Fitness, Past/current experiences, and Time since onset of injury/illness/exacerbation are also affecting patient's functional outcome.   REHAB POTENTIAL: Good  CLINICAL DECISION MAKING: Stable/uncomplicated  EVALUATION COMPLEXITY: Low   GOALS: Goals reviewed with patient? Yes  SHORT TERM GOALS: Target date: 06/07/2022  Will be compliant with appropriate progressive HEP  Baseline: Goal status: INITIAL  2.  Pain in R foot to be no more than 5/10 and in L foot no more than 3/10  Baseline:  Goal status: INITIAL  3.  Ankle DF ROM to be at least 10 degrees B and inversion/eversion ROM to have improved by at least 10 degrees B  Baseline:  Goal status: INITIAL  4.  B gastroc flexibility to have improved by 50% B  Baseline:  Goal status: INITIAL    LONG TERM GOALS:  Target date: 06/28/2022   MMT to have improved by at least 1 grade in all weak groups  Baseline:  Goal status: INITIAL  2.  Will be able to ambulate community distances with pain no more than 4/10 B  Baseline:  Goal status: INITIAL  3.  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  LEFS score to have improved by at least 15 points to show improvement in function and pain  Baseline:  Goal status: INITIAL    PLAN: PT FREQUENCY: 1-2x/week  PT DURATION: 6 weeks  PLANNED INTERVENTIONS: Therapeutic exercises, Therapeutic activity, Neuromuscular re-education, Balance training, Gait training, Patient/Family education, Self Care, Joint mobilization, Orthotic/Fit training, Aquatic Therapy, Dry Needling, Electrical stimulation, Cryotherapy, Moist heat, Taping, Ultrasound, Ionotophoresis 4mg /ml Dexamethasone, Manual  therapy, and Re-evaluation  PLAN FOR NEXT SESSION: ankle mobility and stretching, STM and consider DN, gait training, strengthening, arch strengthening    Jayme Cham U PT DPT PN2  05/17/2022, 8:51 AM

## 2022-05-24 ENCOUNTER — Ambulatory Visit: Payer: No Typology Code available for payment source | Admitting: Physical Therapy

## 2022-05-31 ENCOUNTER — Encounter: Payer: No Typology Code available for payment source | Admitting: Physical Therapy

## 2022-06-01 ENCOUNTER — Other Ambulatory Visit (HOSPITAL_BASED_OUTPATIENT_CLINIC_OR_DEPARTMENT_OTHER): Payer: Self-pay

## 2022-06-01 ENCOUNTER — Ambulatory Visit: Payer: No Typology Code available for payment source | Attending: Podiatry

## 2022-06-01 DIAGNOSIS — M79672 Pain in left foot: Secondary | ICD-10-CM

## 2022-06-01 DIAGNOSIS — M79671 Pain in right foot: Secondary | ICD-10-CM | POA: Diagnosis present

## 2022-06-01 DIAGNOSIS — M6281 Muscle weakness (generalized): Secondary | ICD-10-CM | POA: Diagnosis present

## 2022-06-01 DIAGNOSIS — M25672 Stiffness of left ankle, not elsewhere classified: Secondary | ICD-10-CM

## 2022-06-01 DIAGNOSIS — R262 Difficulty in walking, not elsewhere classified: Secondary | ICD-10-CM

## 2022-06-01 DIAGNOSIS — M25671 Stiffness of right ankle, not elsewhere classified: Secondary | ICD-10-CM

## 2022-06-01 NOTE — Therapy (Signed)
OUTPATIENT PHYSICAL THERAPY TREATMENT   Patient Name: Destiny Lee MRN: [id number] DOB:01/19/1981, 41 y.o., female Today's Date: 06/01/2022   PT End of Session - 06/01/22 1706     Visit Number 2    Number of Visits 7    Date for PT Re-Evaluation 06/28/22    Authorization Type [facility]    Authorization Time Period 05/17/22 to 06/28/22    PT Start Time 1620    PT Stop Time 1701    PT Time Calculation (min) 41 min    Activity Tolerance Patient tolerated treatment well    Behavior During Therapy [facility] for tasks assessed/performed              Past Medical History:  Diagnosis Date   Obesity    Past Surgical History:  Procedure Laterality Date   NO PAST SURGERIES     Patient Active Problem List   Diagnosis Date Noted   GAD (generalized anxiety disorder) 08/27/2020   Depression, major, single episode, moderate ([place]) 08/27/2020   Seasonal allergic rhinitis 01/31/2020   Sacroiliac dysfunction 08/07/2019   Morbid obesity ([place]) 08/07/2019   Pityriasis rosea-like skin eruption 07/11/2016    PCP: Shelda Pal   REFERRING PROVIDER: Lorenda Peck, DPM   REFERRING DIAG: M72.2 (ICD-10-CM) - Plantar fasciitis, bilateral   THERAPY DIAG:  Pain in left foot  Pain in right foot  Stiffness of left ankle, not elsewhere classified  Stiffness of right ankle, not elsewhere classified  Difficulty in walking, not elsewhere classified  Muscle weakness (generalized)  Rationale for Evaluation and Treatment Rehabilitation  ONSET DATE: 05/03/2022   SUBJECTIVE:   SUBJECTIVE STATEMENT: Doing good with exercises although does not know if she is doing them right.  PERTINENT HISTORY: 41 y.o. female presents for  follow-up of bilateral plantar fasciitis. Relates it is doing worse since last visit. She has been stretching and icing but nothing helping and the inserts have been painful. She has seen Dr. March Rummage in the past for neuroma pain. Relates numbness along the bottom  of both feet for about 3 years. Relates throbbing and aches. Denies any treatments recently.   . Denies any other pedal complaints. Denies n/v/f/c.   PAIN:  Are you having pain? Yes: NPRS scale: 8-9/10 in R foot, L foot 6/10 Pain location: B feet  Pain description: burning, numbness, shooting pains Aggravating factors: standing/walking  Relieving factors: soaking in hot water   PRECAUTIONS: None  WEIGHT BEARING RESTRICTIONS No  FALLS:  Has patient fallen in last 6 months? 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   PLOF: Independent, Independent with basic ADLs, Independent with gait, and Independent with transfers  PATIENT GOALS reduce pain, proper stretching techniques for my feet and legs    OBJECTIVE:   DIAGNOSTIC FINDINGS:   PATIENT SURVEYS:  LEFS 36/80  COGNITION:  Overall cognitive status: Within functional limits for tasks assessed     SENSATION: Not tested  EDEMA:    MUSCLE LENGTH:  HS WNL L, mild limitation R  Piriformis mild limitation L, moderate limitation R   POSTURE: noted B arch drop in stance   PALPATION: B gastrocs tight and TTP, pain greater R>L at plantar fascia mm insertion R foot, forefeet both tender to touch, plantar fascia muscles tender B with R>L   LOWER EXTREMITY ROM:  Active ROM  Right eval Left eval  Hip flexion    Hip extension    Hip abduction    Hip adduction    Hip internal rotation    Hip external rotation    Knee flexion    Knee extension    Ankle dorsiflexion -4 -2  Ankle plantarflexion 53 57  Ankle inversion 36 40  Ankle eversion 5 2   (Blank rows = not tested)  LOWER EXTREMITY MMT:  MMT Right eval Left eval  Hip flexion 4+ 4+  Hip extension 4 4  Hip abduction 4 4  Hip adduction    Hip internal rotation    Hip external  rotation    Knee flexion 3+ 3  Knee extension 5 5  Ankle dorsiflexion 5 5  Ankle plantarflexion    Ankle inversion 5 5  Ankle eversion 5 5   (Blank rows = not tested)  LOWER EXTREMITY SPECIAL TESTS:    FUNCTIONAL TESTS:    GAIT: Distance walked: antalgic, B arch drop, wide BOS, trendelenburg  Assistive device utilized: None Level of assistance: Complete Independence Comments:     TODAY'S TREATMENT: 06/01/22 Bike L2x44min Ankle circles x 10 bil Ankle ABC x 2 bil Gastroc bil 2x30 sec with strap Toe curls x 10 bil  MT: STM to bil plantar fascia and medial foot  Nustep L3 x6 minutes BLEs only   Ankle circles CW/CCW x10 B  Ankle alphabet x1 B  Gastroc stretch on door frame x30 seconds B    PATIENT EDUCATION:  Education details: HEP update Person educated: Patient Education method: Explanation Education comprehension: verbalized understanding and returned demonstration   HOME EXERCISE PROGRAM: Access Code: [email or web address] URL: [email or web address] Date: 06/01/2022 Prepared by: Verta Ellen  Exercises - Seated Ankle Circles  - 2-3 x daily - 7 x weekly - 1 sets - 20 reps - Seated Ankle Alphabet  - 2-3 x daily - 7 x weekly - 1 sets - 2-3 reps - Gastroc Stretch with Foot at Wall  - 2-3 x daily - 7 x weekly - 1 sets - 3 reps - 30 hold - Long Sitting Calf Stretch with Strap  - 1 x daily - 7 x weekly - 3 sets - 30 sec hold - Seated Toe Curl  - 1 x daily - 7 x weekly - 3 sets - 10 reps  ASSESSMENT:  CLINICAL IMPRESSION: Good response to initial treatment. Progressed HEP and modified gastroc to sitting to allow for more stretch. TTP found along R medial foot and plantar fascia. Pt had no issues with exercises today, she would benefit from more progression of ankle strengthening and plantar fascia flexibility.   OBJECTIVE IMPAIRMENTS Abnormal gait, decreased mobility, difficulty walking, decreased ROM, decreased strength, hypomobility, increased edema,  increased fascial restrictions, increased muscle spasms, impaired flexibility, postural dysfunction, obesity, and pain.   ACTIVITY LIMITATIONS standing, squatting, stairs, transfers, and locomotion level  PARTICIPATION LIMITATIONS: shopping, community activity, occupation, and yard work  PERSONAL FACTORS Age, Behavior pattern, Fitness, Past/current experiences, and Time since onset of injury/illness/exacerbation are also affecting patient's functional outcome.   REHAB POTENTIAL: Good  CLINICAL DECISION MAKING: Stable/uncomplicated  EVALUATION COMPLEXITY: Low   GOALS: Goals reviewed with patient? Yes  SHORT TERM GOALS: Target date: 06/07/2022  Will be compliant with appropriate progressive HEP  Baseline: Goal status: INITIAL  2.  Pain in R foot to be no more than 5/10 and in L foot no more than 3/10  Baseline:  Goal status: INITIAL  3.  Ankle DF ROM  to be at least 10 degrees B and inversion/eversion ROM to have improved by at least 10 degrees B  Baseline:  Goal status: INITIAL  4.  B gastroc flexibility to have improved by 50% B  Baseline:  Goal status: INITIAL    LONG TERM GOALS: Target date: 06/28/2022   MMT to have improved by at least 1 grade in all weak groups  Baseline:  Goal status: INITIAL  2.  Will be able to ambulate community distances with pain no more than 4/10 B  Baseline:  Goal status: INITIAL  3.  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  LEFS score to have improved by at least 15 points to show improvement in function and pain  Baseline:  Goal status: INITIAL    PLAN: PT FREQUENCY: 1-2x/week  PT DURATION: 6 weeks  PLANNED INTERVENTIONS: Therapeutic exercises, Therapeutic activity, Neuromuscular re-education, Balance training, Gait training, Patient/Family education, Self Care, Joint mobilization, Orthotic/Fit training, Aquatic  Therapy, Dry Needling, Electrical stimulation, Cryotherapy, Moist heat, Taping, Ultrasound, Ionotophoresis 4mg /ml Dexamethasone, Manual therapy, and Re-evaluation  PLAN FOR NEXT SESSION: ankle mobility and stretching, STM and consider DN, gait training, strengthening, arch strengthening    , PTA 06/01/2022, 5:07 PM

## 2022-06-01 NOTE — Progress Notes (Signed)
NEW PATIENT Date of Service/Encounter:  06/02/22 Referring provider: Sharlene Dory* Primary care provider: Sharlene Dory, DO  Subjective:  Destiny Lee is a 41 y.o. female with a PMHx of anxiety, depression, pityriasis rosea presenting today for evaluation of concern for allergic reaction to alcohol. History obtained from: chart review and patient.   Alcohol- In the past, patient has noticed that with certain wines she will have increased nasal congestion and sensation of throat tightness.  Historically she thought this was due to buying bottom shelf alcohol, and has since used "top shelf" products since.  She was tolerating well until following her recent cruise where she did consume alcohol regularly without symptoms. However this past weekend, she started to note symptoms of extreme nasal congestion and sensation of throat tightness with all different types of alcohol. Friday-took a sip of michelob ultra and immediately had symptoms Sat-tried crown Royal, took antihistamine prior to drinking and it still bothered her Monday - tried burbon and did the same thing No itching or hives.  No vomiting or diarrhea, no other systemic symptoms She is able to eat wheat and potatoes, grapes, etc. without any issues.  Chronic rhinitis and conjunctivitis: She does get a lot of watery eyes and sneezing. Takes nasacort, montelukast and zyrtec.  She feels her symptoms are controlled on this combination. She has never had allergy testing Year-round, no seasonal flares. Ongoing since early 84s.  Other allergy screening: Asthma: no Medication allergy: no Hymenoptera allergy: no Urticaria: no Eczema:no History of recurrent infections suggestive of immunodeficency: no Vaccinations are up to date.   Past Medical History: Past Medical History:  Diagnosis Date   Obesity    Medication List:  Current Outpatient Medications  Medication Sig Dispense Refill   azelastine  (ASTELIN) 0.1 % nasal spray Place 2 sprays into both nostrils 2 (two) times daily. Use in each nostril as directed 30 mL 12   dicyclomine (BENTYL) 10 MG capsule Take 1 tab every 6 hours as needed for abdominal cramping. 60 capsule 0   levocetirizine (XYZAL) 5 MG tablet TAKE 1 TABLET (5 MG TOTAL) BY MOUTH EVERY EVENING. 90 tablet 2   levonorgestrel (MIRENA) 20 MCG/24HR IUD 1 each by Intrauterine route once.     meloxicam (MOBIC) 15 MG tablet Take 1 tablet (15 mg total) by mouth daily. 30 tablet 0   montelukast (SINGULAIR) 10 MG tablet TAKE 1 TABLET (10 MG TOTAL) BY MOUTH AT BEDTIME. 90 tablet 2   Semaglutide-Weight Management 0.5 MG/0.5ML SOAJ Inject 0.5 mg into the skin once a week for 28 days. 2 mL 0   Semaglutide-Weight Management 1.7 MG/0.75ML SOAJ Inject 1.7 mg into the skin once a week for 28 days. 3 mL 0   [START ON 07/01/2022] Semaglutide-Weight Management 2.4 MG/0.75ML SOAJ Inject 2.4 mg into the skin once a week for 28 days. 3 mL 0   fluticasone (FLONASE) 50 MCG/ACT nasal spray PLACE 2 SPRAYS INTO BOTH NOSTRILS DAILY. 16 g 1   No current facility-administered medications for this visit.   Known Allergies:  No Known Allergies Past Surgical History: Past Surgical History:  Procedure Laterality Date   NO PAST SURGERIES     Family History: Family History  Problem Relation Age of Onset   Breast cancer Maternal Aunt    Asthma Other    Colon cancer Neg Hx    CAD Neg Hx    Diabetes Neg Hx    Social History: Destiny Lee lives in a house, with water damage, carpet  floors, electric heating, central AC, no pets, works as a Armed forces training and education officer, no HEPA filter in the home, [place] an [place].  No smoking history.  ROS:  All other systems negative except as noted per HPI.  Objective:  Blood pressure 122/78, pulse 76, temperature 98.2 F (36.8 C), temperature source Temporal, resp. rate 18, height 5' 5.5" (1.664 m), weight (!) 321 lb 9.6 oz (145.9 kg), SpO2 96  %. Body mass index is 52.7 kg/m. Physical Exam:  General Appearance:  Alert, cooperative, no distress, appears stated age  Head:  Normocephalic, without obvious abnormality, atraumatic  Eyes:  Conjunctiva clear, EOM's intact  Nose: Nares normal, hypertrophic turbinates, normal mucosa, no visible anterior polyps, and septum midline  Throat: Lips, tongue normal; teeth and gums normal, normal posterior oropharynx  Neck: Supple, symmetrical  Lungs:   clear to auscultation bilaterally, Respirations unlabored, no coughing  Heart:  regular rate and rhythm and no murmur, Appears well perfused  Extremities: No edema  Skin: Skin color, texture, turgor normal, no rashes or lesions on visualized portions of skin  Neurologic: No gross deficits     Diagnostics: None-patient deferred environmental allergy testing today  Assessment and Plan  Alcohol Intolerance:  -Since you are having issues with all types of alcohol, this is more consistent with alcohol intolerance than a true allergy which would be specific to each ingredient of which that alcohol was processed and made (for example grapes for wine, hops for beer, wheat and potatoes for alcohol, etc.) -Avoidance is recommended -Can try antihistamine prior to ingestion, unclear if this will be helpful  Your options include: Zyrtec (cetirizine) 10 mg, Claritin (loratadine) 10 mg, Xyzal (levocetirizine) 5 mg or Allegra (fexofenadine) 180 mg daily as needed   Chronic rhinitis and conjunctivitis-likely allergic -Consider environmental allergy testing in the future if your symptoms are not controlled with current regimen -Continue Zyrtec 10 mg daily, singular 10 mg nightly, and Nasacort 2 sprays each nostril daily -Consider Pataday extra strength (over-the-counter) as needed for eye symptoms  Follow-up as needed. It was a pleasure meeting you in clinic today.  Thank you for letting me partake in your care.  This note in its entirety was forwarded  to the Provider who requested this consultation.  Thank you for your kind referral. I appreciate the opportunity to take part in Destiny Lee's care. Please do not hesitate to contact me with questions.  Sincerely,  Tonny Bollman, MD [facility]

## 2022-06-02 ENCOUNTER — Ambulatory Visit (INDEPENDENT_AMBULATORY_CARE_PROVIDER_SITE_OTHER): Payer: No Typology Code available for payment source | Admitting: Internal Medicine

## 2022-06-02 ENCOUNTER — Other Ambulatory Visit (HOSPITAL_BASED_OUTPATIENT_CLINIC_OR_DEPARTMENT_OTHER): Payer: Self-pay

## 2022-06-02 ENCOUNTER — Encounter: Payer: Self-pay | Admitting: Internal Medicine

## 2022-06-02 VITALS — BP 122/78 | HR 76 | Temp 98.2°F | Resp 18 | Ht 65.5 in | Wt 321.6 lb

## 2022-06-02 DIAGNOSIS — J309 Allergic rhinitis, unspecified: Secondary | ICD-10-CM

## 2022-06-02 DIAGNOSIS — T781XXA Other adverse food reactions, not elsewhere classified, initial encounter: Secondary | ICD-10-CM

## 2022-06-02 NOTE — Patient Instructions (Signed)
Alcohol Intolerance:  -Since you are having issues with all types of alcohol, this is more consistent with alcohol intolerance -Avoidance is recommended -Can try antihistamine prior to ingestion, unclear if this will be helpful  Your options include: Zyrtec (cetirizine) 10 mg, Claritin (loratadine) 10 mg, Xyzal (levocetirizine) 5 mg or Allegra (fexofenadine) 180 mg daily as needed   Chronic rhinitis-likely allergic -Consider environmental allergy testing in the future if your symptoms are not controlled with current regimen -Continue Zyrtec 10 mg daily, singular 10 mg nightly, and Nasacort 2 sprays each nostril daily -Consider Pataday extra strength (over-the-counter) as needed for eye symptoms

## 2022-06-07 ENCOUNTER — Ambulatory Visit: Payer: No Typology Code available for payment source | Admitting: Physical Therapy

## 2022-06-13 NOTE — Therapy (Signed)
OUTPATIENT PHYSICAL THERAPY TREATMENT   Patient Name: Destiny Lee MRN: [id number] DOB:08/13/1981, 41 y.o., female Today's Date: 06/14/2022   PT End of Session - 06/14/22 1304     Visit Number 3    Number of Visits 7    Date for PT Re-Evaluation 06/28/22    Authorization Type [facility]    Authorization Time Period 05/17/22 to 06/28/22    PT Start Time 1302    PT Stop Time 1345    PT Time Calculation (min) 43 min    Activity Tolerance Patient tolerated treatment well    Behavior During Therapy [facility] for tasks assessed/performed               Past Medical History:  Diagnosis Date   Obesity    Past Surgical History:  Procedure Laterality Date   NO PAST SURGERIES     Patient Active Problem List   Diagnosis Date Noted   GAD (generalized anxiety disorder) 08/27/2020   Depression, major, single episode, moderate ([place]) 08/27/2020   Seasonal allergic rhinitis 01/31/2020   Sacroiliac dysfunction 08/07/2019   Morbid obesity ([place]) 08/07/2019   Pityriasis rosea-like skin eruption 07/11/2016    PCP: Shelda Pal   REFERRING PROVIDER: Lorenda Peck, DPM   REFERRING DIAG: M72.2 (ICD-10-CM) - Plantar fasciitis, bilateral   THERAPY DIAG:  Pain in left foot  Pain in right foot  Stiffness of left ankle, not elsewhere classified  Stiffness of right ankle, not elsewhere classified  Difficulty in walking, not elsewhere classified  Muscle weakness (generalized)  Rationale for Evaluation and Treatment Rehabilitation  ONSET DATE: 05/03/2022   SUBJECTIVE:   SUBJECTIVE STATEMENT: I'm wearing a compression sock today on the right   PAIN:  Are you having pain? Yes: NPRS scale: 4/10 in R foot, L foot 0/10 Pain location: B feet  Pain description: burning, numbness, shooting pains Aggravating factors: standing/walking  Relieving factors: soaking in hot water   PERTINENT HISTORY: 41 y.o. female presents for  follow-up of bilateral plantar fasciitis. Relates  it is doing worse since last visit. She has been stretching and icing but nothing helping and the inserts have been painful. She has seen Dr. March Rummage in the past for neuroma pain. Relates numbness along the bottom of both feet for about 3 years. Relates throbbing and aches. Denies any treatments recently.   . Denies any other pedal complaints. Denies n/v/f/c.   PRECAUTIONS: None  WEIGHT BEARING RESTRICTIONS No  FALLS:  Has patient fallen in last 6 months? 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   PLOF: Independent, Independent with basic ADLs, Independent with gait, and Independent with transfers  PATIENT GOALS reduce pain, proper stretching techniques for my feet and legs    OBJECTIVE:   DIAGNOSTIC FINDINGS:   PATIENT SURVEYS:  LEFS 36/80  COGNITION:  Overall cognitive status: Within functional limits for tasks assessed     SENSATION: Not tested  EDEMA:    MUSCLE LENGTH:  HS WNL L, mild limitation R  Piriformis mild limitation L, moderate limitation R   POSTURE: noted B arch drop in stance   PALPATION: B gastrocs tight and TTP, pain greater R>L at plantar fascia mm insertion R foot, forefeet both tender to touch, plantar fascia muscles tender B with R>L   LOWER EXTREMITY ROM:  Active ROM Right eval Left  eval Right 06/14/22 Left 06/14/22  Ankle dorsiflexion -4 -2 -5 2  Ankle plantarflexion 53 57    Ankle inversion 36 40    Ankle eversion 5 2 10 22    (Blank rows = not tested)  LOWER EXTREMITY MMT:  MMT Right eval Left eval  Hip flexion 4+ 4+  Hip extension 4 4  Hip abduction 4 4  Hip adduction    Hip internal rotation    Hip external rotation    Knee flexion 3+ 3  Knee extension 5 5  Ankle dorsiflexion 5 5  Ankle plantarflexion    Ankle inversion 5 5  Ankle eversion 5 5    (Blank rows = not tested)  LOWER EXTREMITY SPECIAL TESTS:    FUNCTIONAL TESTS:    GAIT: Distance walked: antalgic, B arch drop, wide BOS, trendelenburg  Assistive device utilized: None Level of assistance: Complete Independence Comments:     TODAY'S TREATMENT: 06/14/22 Bike L2x6 min Gastroc stretch at wall 2x 30 sec ea Soleus stretch at wall 2x 30 sec ea Toe flexion stretch in kneeling position x 30 sec Seated arch lifts 5 sec hold x 10    MT: STM to bil plantar fascia and medial foot, also lateral gastroc R Cuboid mobs bil Measurements taken   06/01/22 Bike L2x76mn Ankle circles x 10 bil Ankle ABC x 2 bil Gastroc bil 2x30 sec with strap Toe curls x 10 bil  MT: STM to bil plantar fascia and medial foot  Nustep L3 x6 minutes BLEs only   Ankle circles CW/CCW x10 B  Ankle alphabet x1 B  Gastroc stretch on door frame x30 seconds B    PATIENT EDUCATION:  Education details: HEP update, DN handout and explanation 06/14/22 Person educated: Patient Education method: Explanation, Demonstration, Verbal cues, and Handouts Education comprehension: verbalized understanding and returned demonstration   HOME EXERCISE PROGRAM: Access Code: [email or web address] ASSESSMENT:  CLINICAL IMPRESSION: SCaileighpresents with ongoing pain in bil feet R>L. We discussed DN and plan to do intial trial next visit to bil feet and lateral gastrocs. HEP progressed with gastroc/soleus stretches and kneeling PF stretch. She has made considerable gains in eversion ROM, but is still limited in ankle DF. Mozell continues to demonstrate potential for improvement and would benefit from continued skilled therapy to address impairments.     OBJECTIVE IMPAIRMENTS Abnormal gait, decreased mobility, difficulty walking, decreased ROM, decreased strength, hypomobility, increased edema, increased fascial restrictions, increased muscle spasms, impaired flexibility, postural dysfunction, obesity, and pain.    ACTIVITY LIMITATIONS standing, squatting, stairs, transfers, and locomotion level  PARTICIPATION LIMITATIONS: shopping, community activity, occupation, and yard work  PERSONAL FACTORS Age, Behavior pattern, Fitness, Past/current experiences, and Time since onset of injury/illness/exacerbation are also affecting patient's functional outcome.   REHAB POTENTIAL: Good  CLINICAL DECISION MAKING: Stable/uncomplicated  EVALUATION COMPLEXITY: Low   GOALS: Goals reviewed with patient? Yes  SHORT TERM GOALS: Target date: 06/07/2022  Will be compliant with appropriate progressive HEP  Baseline: Goal status: MET  2.  Pain in R foot to be no more than 5/10 and in L foot no more than 3/10  Baseline:  Goal status: IN PROGRESS  3.  Ankle DF ROM to be at least 10 degrees B and inversion/eversion ROM to have improved by at least 10 degrees B  Baseline:  Goal status: IN PROGRESS Eversion MET on Left  4.  B gastroc flexibility to have improved by 50% B  Baseline:  Goal status: IN PROGRESS    LONG TERM  GOALS: Target date: 06/28/2022   MMT to have improved by at least 1 grade in all weak groups  Baseline:  Goal status: INITIAL  2.  Will be able to ambulate community distances with pain no more than 4/10 B  Baseline:  Goal status: INITIAL  3.  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  LEFS score to have improved by at least 15 points to show improvement in function and pain  Baseline:  Goal status: INITIAL    PLAN: PT FREQUENCY: 1-2x/week  PT DURATION: 6 weeks  PLANNED INTERVENTIONS: Therapeutic exercises, Therapeutic activity, Neuromuscular re-education, Balance training, Gait training, Patient/Family education, Self Care, Joint mobilization, Orthotic/Fit training, Aquatic Therapy, Dry Needling, Electrical stimulation, Cryotherapy, Moist heat, Taping, Ultrasound,  Ionotophoresis 57m/ml Dexamethasone, Manual therapy, and Re-evaluation  PLAN FOR NEXT SESSION: DN to bil quadratus plantae and lateral gastrocs, ankle mobility and stretching, STM , gait training, strengthening, arch strengthening    JMadelyn Flavors PT 06/14/2022, 2:51 PM

## 2022-06-14 ENCOUNTER — Ambulatory Visit: Payer: No Typology Code available for payment source | Admitting: Physical Therapy

## 2022-06-14 ENCOUNTER — Other Ambulatory Visit (HOSPITAL_BASED_OUTPATIENT_CLINIC_OR_DEPARTMENT_OTHER): Payer: Self-pay

## 2022-06-14 ENCOUNTER — Encounter: Payer: Self-pay | Admitting: Physical Therapy

## 2022-06-14 DIAGNOSIS — M25672 Stiffness of left ankle, not elsewhere classified: Secondary | ICD-10-CM

## 2022-06-14 DIAGNOSIS — R262 Difficulty in walking, not elsewhere classified: Secondary | ICD-10-CM

## 2022-06-14 DIAGNOSIS — M79672 Pain in left foot: Secondary | ICD-10-CM | POA: Diagnosis not present

## 2022-06-14 DIAGNOSIS — M25671 Stiffness of right ankle, not elsewhere classified: Secondary | ICD-10-CM

## 2022-06-14 DIAGNOSIS — M79671 Pain in right foot: Secondary | ICD-10-CM

## 2022-06-14 DIAGNOSIS — M6281 Muscle weakness (generalized): Secondary | ICD-10-CM

## 2022-06-15 NOTE — Therapy (Signed)
OUTPATIENT PHYSICAL THERAPY TREATMENT   Patient Name: Destiny Lee MRN: [id number] DOB:[birth year], 41 y.o., female Today's Date: 06/21/2022   PT End of Session - 06/21/22 1303     Visit Number 4    Number of Visits 7    Date for PT Re-Evaluation 06/28/22    Authorization Type [facility]    Authorization Time Period 05/17/22 to 06/28/22    PT Start Time 1303    PT Stop Time 1345    PT Time Calculation (min) 42 min    Activity Tolerance Patient tolerated treatment well    Behavior During Therapy [facility] for tasks assessed/performed                Past Medical History:  Diagnosis Date   Obesity    Past Surgical History:  Procedure Laterality Date   NO PAST SURGERIES     Patient Active Problem List   Diagnosis Date Noted   GAD (generalized anxiety disorder) 08/27/2020   Depression, major, single episode, moderate ([place]) 08/27/2020   Seasonal allergic rhinitis 01/31/2020   Sacroiliac dysfunction 08/07/2019   Morbid obesity ([place]) 08/07/2019   Pityriasis rosea-like skin eruption 07/11/2016    PCP: Destiny Lee   REFERRING PROVIDER: Lorenda Lee, DPM   REFERRING DIAG: M72.2 (ICD-10-CM) - Plantar fasciitis, bilateral   THERAPY DIAG:  Pain in left foot  Pain in right foot  Stiffness of left ankle, not elsewhere classified  Stiffness of right ankle, not elsewhere classified  Difficulty in walking, not elsewhere classified  Muscle weakness (generalized)  Rationale for Evaluation and Treatment Rehabilitation  ONSET DATE: 05/03/2022   SUBJECTIVE:   SUBJECTIVE STATEMENT: It's hurting today. I've been limping. I feel like the more stretches I do the more it hurts.   PAIN:  Are you having pain? Yes: NPRS scale: 6-7/10 in R foot, L foot 0/10 Pain location: B feet  Pain description: burning, numbness, shooting pains Aggravating factors: standing/walking  Relieving factors: soaking in hot water   PERTINENT HISTORY: 41 y.o. female presents for   follow-up of bilateral plantar fasciitis. Relates it is doing worse since last visit. She has been stretching and icing but nothing helping and the inserts have been painful. She has seen Destiny Lee in the past for neuroma pain. Relates numbness along the bottom of both feet for about 3 years. Relates throbbing and aches. Denies any treatments recently.   . Denies any other pedal complaints. Denies n/v/f/c.   PRECAUTIONS: None  WEIGHT BEARING RESTRICTIONS No  FALLS:  Has patient fallen in last 6 months? 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   PLOF: Independent, Independent with basic ADLs, Independent with gait, and Independent with transfers  PATIENT GOALS reduce pain, proper stretching techniques for my feet and legs    OBJECTIVE:   DIAGNOSTIC FINDINGS:   PATIENT SURVEYS:  LEFS 36/80  COGNITION:  Overall cognitive status: Within functional limits for tasks assessed     SENSATION: Not tested  EDEMA:    MUSCLE LENGTH:  HS WNL L, mild limitation R  Piriformis mild limitation L, moderate limitation R   POSTURE: noted B arch drop in stance   PALPATION: B gastrocs tight and TTP, pain greater R>L at plantar fascia mm insertion R foot, forefeet both tender to touch, plantar fascia muscles tender B with R>L  LOWER EXTREMITY ROM:  Active ROM Right eval Left eval Right 06/14/22 Left 06/14/22  Ankle dorsiflexion -4 -2 -5 2  Ankle plantarflexion 53 57    Ankle inversion 36 40    Ankle eversion 5 2 10 22    (Blank rows = not tested)  LOWER EXTREMITY MMT:  MMT Right eval Left eval  Hip flexion 4+ 4+  Hip extension 4 4  Hip abduction 4 4  Hip adduction    Hip internal rotation    Hip external rotation    Knee flexion 3+ 3  Knee extension 5 5  Ankle dorsiflexion 5 5  Ankle  plantarflexion    Ankle inversion 5 5  Ankle eversion 5 5   (Blank rows = not tested)  LOWER EXTREMITY SPECIAL TESTS:    FUNCTIONAL TESTS:    GAIT: Distance walked: antalgic, B arch drop, wide BOS, trendelenburg  Assistive device utilized: None Level of assistance: Complete Independence Comments:     TODAY'S TREATMENT: 06/21/22 Bike L3x6 min Gastroc stretch at wall 2x 30 sec ea Soleus stretch at wall 2x 30 sec ea Toe flexion stretch in kneeling position x 30 sec Seated arch lifts 5 sec hold x 10 Seated fig 4 stretch x 30 sec bil    Manual Therapy:   Skilled palpation and monitoring of soft tissues during DN STM and deep MFR to right plantar fascia, ABD hallucis and R gastroc     Trigger Point Dry-Needling  Treatment instructions: Expect mild to moderate muscle soreness. S/S of pneumothorax if dry needled over a lung field, and to seek immediate medical attention should they occur. Patient verbalized understanding of these instructions and education.  Patient Consent Given: Yes Education handout provided: Previously provided Muscles treated: R quadratus plantae, ABD hallucis, R lateral gastroc Electrical stimulation performed: No Parameters: N/A Treatment response/outcome: Twitch Response Elicited and Palpable Increase in Muscle Length   06/14/22 Bike L2x6 min Gastroc stretch at wall 2x 30 sec ea Soleus stretch at wall 2x 30 sec ea Toe flexion stretch in kneeling position x 30 sec Seated arch lifts 5 sec hold x 10    MT: STM to bil plantar fascia and medial foot, also lateral gastroc R Cuboid mobs bil Measurements taken   06/01/22 Bike L2x36mn Ankle circles x 10 bil Ankle ABC x 2 bil Gastroc bil 2x30 sec with strap Toe curls x 10 bil  MT: STM to bil plantar fascia and medial foot  Nustep L3 x6 minutes BLEs only   Ankle circles CW/CCW x10 B  Ankle alphabet x1 B  Gastroc stretch on door frame x30 seconds B    PATIENT EDUCATION:  Education details:  HEP update 06/21/22 Person educated: Patient Education method: Explanation, Demonstration, Verbal cues, and Handouts Education comprehension: verbalized understanding and returned demonstration   HOME EXERCISE PROGRAM: Access Code: [email or web address] ASSESSMENT:  CLINICAL IMPRESSION: Destiny Lee with no change in right foot pain since last visit. She tolerated initial trial of DN to R quadratus plantae and ABD hallucis very well with palpable decrease in tissue tension and increased ease of squatting to floor post MT/DN. Arch lifts were reviewed and modified to ensure correct form. Milah continues to demonstrate potential for improvement and would benefit from continued skilled therapy to address impairments.    OBJECTIVE IMPAIRMENTS Abnormal gait, decreased mobility, difficulty walking, decreased ROM, decreased strength, hypomobility, increased edema, increased fascial restrictions, increased muscle spasms, impaired flexibility, postural dysfunction, obesity, and pain.   ACTIVITY LIMITATIONS standing, squatting, stairs, transfers, and locomotion  level  PARTICIPATION LIMITATIONS: shopping, community activity, occupation, and yard work  PERSONAL FACTORS Age, Behavior pattern, Fitness, Past/current experiences, and Time since onset of injury/illness/exacerbation are also affecting patient's functional outcome.   REHAB POTENTIAL: Good  CLINICAL DECISION MAKING: Stable/uncomplicated  EVALUATION COMPLEXITY: Low   GOALS: Goals reviewed with patient? Yes  SHORT TERM GOALS: Target date: 06/07/2022  Will be compliant with appropriate progressive HEP  Baseline: Goal status: MET  2.  Pain in R foot to be no more than 5/10 and in L foot no more than 3/10  Baseline:  Goal status: IN PROGRESS  3.  Ankle DF ROM to be at least 10 degrees B and inversion/eversion ROM to have improved by at least 10 degrees B  Baseline:  Goal status: IN PROGRESS Eversion MET on Left  4.  B gastroc flexibility  to have improved by 50% B  Baseline:  Goal status: IN PROGRESS    LONG TERM GOALS: Target date: 06/28/2022   MMT to have improved by at least 1 grade in all weak groups  Baseline:  Goal status: INITIAL  2.  Will be able to ambulate community distances with pain no more than 4/10 B  Baseline:  Goal status: INITIAL  3.  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  LEFS score to have improved by at least 15 points to show improvement in function and pain  Baseline:  Goal status: INITIAL    PLAN: PT FREQUENCY: 1-2x/week  PT DURATION: 6 weeks  PLANNED INTERVENTIONS: Therapeutic exercises, Therapeutic activity, Neuromuscular re-education, Balance training, Gait training, Patient/Family education, Self Care, Joint mobilization, Orthotic/Fit training, Aquatic Therapy, Dry Needling, Electrical stimulation, Cryotherapy, Moist heat, Taping, Ultrasound, Ionotophoresis 87m/ml Dexamethasone, Manual therapy, and Re-evaluation  PLAN FOR NEXT SESSION: Check LTGs, assess response to DN to bil quadratus plantae and lateral gastrocs. Continue with ankle mobility and stretching, STM , gait training, strengthening, arch strengthening    JMadelyn Flavors PT 06/21/2022, 2:57 PM

## 2022-06-21 ENCOUNTER — Ambulatory Visit: Payer: No Typology Code available for payment source | Admitting: Podiatry

## 2022-06-21 ENCOUNTER — Ambulatory Visit: Payer: No Typology Code available for payment source | Admitting: Physical Therapy

## 2022-06-21 ENCOUNTER — Encounter: Payer: Self-pay | Admitting: Physical Therapy

## 2022-06-21 DIAGNOSIS — R262 Difficulty in walking, not elsewhere classified: Secondary | ICD-10-CM

## 2022-06-21 DIAGNOSIS — M6281 Muscle weakness (generalized): Secondary | ICD-10-CM

## 2022-06-21 DIAGNOSIS — M79672 Pain in left foot: Secondary | ICD-10-CM | POA: Diagnosis not present

## 2022-06-21 DIAGNOSIS — M79671 Pain in right foot: Secondary | ICD-10-CM

## 2022-06-21 DIAGNOSIS — M25671 Stiffness of right ankle, not elsewhere classified: Secondary | ICD-10-CM

## 2022-06-21 DIAGNOSIS — M25672 Stiffness of left ankle, not elsewhere classified: Secondary | ICD-10-CM

## 2022-06-22 ENCOUNTER — Other Ambulatory Visit (HOSPITAL_BASED_OUTPATIENT_CLINIC_OR_DEPARTMENT_OTHER): Payer: Self-pay

## 2022-06-24 ENCOUNTER — Other Ambulatory Visit (HOSPITAL_BASED_OUTPATIENT_CLINIC_OR_DEPARTMENT_OTHER): Payer: Self-pay

## 2022-06-28 ENCOUNTER — Ambulatory Visit: Payer: No Typology Code available for payment source | Attending: Podiatry | Admitting: Physical Therapy

## 2022-06-28 DIAGNOSIS — M6281 Muscle weakness (generalized): Secondary | ICD-10-CM | POA: Insufficient documentation

## 2022-06-28 DIAGNOSIS — M79672 Pain in left foot: Secondary | ICD-10-CM | POA: Insufficient documentation

## 2022-06-28 DIAGNOSIS — M79671 Pain in right foot: Secondary | ICD-10-CM | POA: Insufficient documentation

## 2022-06-28 DIAGNOSIS — M25671 Stiffness of right ankle, not elsewhere classified: Secondary | ICD-10-CM | POA: Insufficient documentation

## 2022-06-28 DIAGNOSIS — R262 Difficulty in walking, not elsewhere classified: Secondary | ICD-10-CM | POA: Insufficient documentation

## 2022-06-28 DIAGNOSIS — M25672 Stiffness of left ankle, not elsewhere classified: Secondary | ICD-10-CM | POA: Insufficient documentation

## 2022-06-29 NOTE — Therapy (Signed)
OUTPATIENT PHYSICAL THERAPY TREATMENT   Patient Name: Destiny Lee MRN: [id number] DOB:1981-04-05, 41 y.o., female Today's Date: 06/30/2022   PT End of Session - 06/30/22 0804     Visit Number 5    Number of Visits 11    Date for PT Re-Evaluation 07/21/22    Authorization Type [facility]    Authorization Time Period 06/30/22 to 07/21/22    PT Start Time 0804    PT Stop Time 0856    PT Time Calculation (min) 52 min    Activity Tolerance Patient tolerated treatment well    Behavior During Therapy [facility] for tasks assessed/performed                 Past Medical History:  Diagnosis Date   Obesity    Past Surgical History:  Procedure Laterality Date   NO PAST SURGERIES     Patient Active Problem List   Diagnosis Date Noted   GAD (generalized anxiety disorder) 08/27/2020   Depression, major, single episode, moderate ([place]) 08/27/2020   Seasonal allergic rhinitis 01/31/2020   Sacroiliac dysfunction 08/07/2019   Morbid obesity ([place]) 08/07/2019   Pityriasis rosea-like skin eruption 07/11/2016    PCP: Shelda Pal   REFERRING PROVIDER: Lorenda Peck, DPM   REFERRING DIAG: M72.2 (ICD-10-CM) - Plantar fasciitis, bilateral   THERAPY DIAG:  Pain in left foot  Pain in right foot  Stiffness of left ankle, not elsewhere classified  Stiffness of right ankle, not elsewhere classified  Difficulty in walking, not elsewhere classified  Muscle weakness (generalized)  Rationale for Evaluation and Treatment Rehabilitation  ONSET DATE: 05/03/2022   SUBJECTIVE:   SUBJECTIVE STATEMENT: Patient reports that she hasn't seen much if any improvement in the R foot, but the left foot is better. Pain is worse in the morning or after she has been sitting for a while.  PAIN:  Are you having pain? Yes: NPRS scale: 5/10 in R foot, L foot 0/10 Pain location: B feet  Pain description: burning, numbness, shooting pains Aggravating factors: standing/walking  Relieving  factors: soaking in hot water   PERTINENT HISTORY: 41 y.o. female presents for  follow-up of bilateral plantar fasciitis. Relates it is doing worse since last visit. She has been stretching and icing but nothing helping and the inserts have been painful. She has seen Dr. March Rummage in the past for neuroma pain. Relates numbness along the bottom of both feet for about 3 years. Relates throbbing and aches. Denies any treatments recently.   . Denies any other pedal complaints. Denies n/v/f/c.   PRECAUTIONS: None  WEIGHT BEARING RESTRICTIONS No  FALLS:  Has patient fallen in last 6 months? 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   PLOF: Independent, Independent with basic ADLs, Independent with gait, and Independent with transfers  PATIENT GOALS reduce pain, proper stretching techniques for my feet and legs    OBJECTIVE:   DIAGNOSTIC FINDINGS:   PATIENT SURVEYS:  LEFS 36/80  COGNITION:  Overall cognitive status: Within functional limits for tasks assessed     SENSATION: Not tested  EDEMA:    MUSCLE LENGTH:  HS WNL L, mild limitation R  Piriformis mild limitation L, moderate limitation R   POSTURE: noted B arch drop in stance   PALPATION: B gastrocs tight and TTP, pain greater R>L at plantar fascia  mm insertion R foot, forefeet both tender to touch, plantar fascia muscles tender B with R>L   LOWER EXTREMITY ROM:  Active ROM Right eval Left eval Right 06/14/22 Left 06/14/22 Right 06/30/22 Left 06/30/22  Ankle dorsiflexion -4 -2 -5 2 4/7 -4/5  Ankle plantarflexion 53 57      Ankle inversion 36 40      Ankle eversion 5 2 10 22  33 29   (Blank rows = not tested)  LOWER EXTREMITY MMT:  MMT Right eval Left eval Right 06/30/22 Left 06/30/22  Hip flexion 4+ 4+ 4+ 4+  Hip extension 4 4 4+ 5   Hip abduction 4 4 4+ 5  Hip adduction      Hip internal rotation      Hip external rotation      Knee flexion 3+ 3 4+ 4+  Knee extension 5 5    Ankle dorsiflexion 5 5    Ankle plantarflexion      Ankle inversion 5 5    Ankle eversion 5 5     (Blank rows = not tested)   GAIT: Distance walked: antalgic, B arch drop, wide BOS, trendelenburg  Assistive device utilized: None Level of assistance: Complete Independence Comments:     TODAY'S TREATMENT:  06/30/22 Bike L3x6 min PF stretch with toe on wall 2x30 sec R Gastroc stretch at wall 2x 30 sec R Soleus stretch at wall 2x 30 sec ea  Therapeutic Activities: MMT, ROM, LEFS completed, discussion of POC  Modalities:  [place] 1.5 W/cm2 cont x 8 min to right plantar fascia medial Ionotophoresis with 1.0 mL 21m/ml Dexamethasone - 4-6 hour patch (843mmin) to R plantar fascia (patch #1 of 6)   06/21/22 Bike L3x6 min Gastroc stretch at wall 2x 30 sec ea Soleus stretch at wall 2x 30 sec ea Toe flexion stretch in kneeling position x 30 sec Seated arch lifts 5 sec hold x 10 Seated fig 4 stretch x 30 sec bil    Manual Therapy:   Skilled palpation and monitoring of soft tissues during DN STM and deep MFR to right plantar fascia, ABD hallucis and R gastroc     Trigger Point Dry-Needling  Treatment instructions: Expect mild to moderate muscle soreness. S/S of pneumothorax if dry needled over a lung field, and to seek immediate medical attention should they occur. Patient verbalized understanding of these instructions and education.  Patient Consent Given: Yes Education handout provided: Previously provided Muscles treated: R quadratus plantae, ABD hallucis, R lateral gastroc Electrical stimulation performed: No Parameters: N/A Treatment response/outcome: Twitch Response Elicited and Palpable Increase in Muscle Length   06/14/22 Bike L2x6 min Gastroc stretch at wall 2x 30 sec ea Soleus stretch at wall 2x 30 sec ea Toe flexion  stretch in kneeling position x 30 sec Seated arch lifts 5 sec hold x 10    MT: STM to bil plantar fascia and medial foot, also lateral gastroc R Cuboid mobs bil Measurements taken   06/01/22 Bike L2x6m46mAnkle circles x 10 bil Ankle ABC x 2 bil Gastroc bil 2x30 sec with strap Toe curls x 10 bil  MT: STM to bil plantar fascia and medial foot  Nustep L3 x6 minutes BLEs only   Ankle circles CW/CCW x10 B  Ankle alphabet x1 B  Gastroc stretch on door frame x30 seconds B    PATIENT EDUCATION Education details: ongoing PT POC and [place] patch wearing instructions 06/30/22 Person educated: Patient Education method: Explanation and Handouts Education comprehension: verbalized understanding  HOME EXERCISE PROGRAM: Access Code: [email or web address]  ASSESSMENT:  CLINICAL IMPRESSION: Destiny Lee has made improvements with ROM and strengthening, but is still limited by pain in the right foot. Her LEFS has increased to 58/80 from 36/80 showing functional improvement. Her left foot is still tight, but she denies pain with ADLS. She still has limitations in bil ankle DF and bil hip weakness. She is unable to walk without pain when not wearing a shoe on her R foot and reports greatest pain in the morning and after sitting for a prolonged period. She has marked pain along right flexor digitorum muscle and may benefit from DN here. Initial trial of iontophoresis and US done today and effects will be assessed at next visit.  Destiny Lee continues to demonstrate potential for improvement and would benefit from continued skilled therapy to address impairments. Recommending 2x/wk for 2-3 more weeks.   OBJECTIVE IMPAIRMENTS Abnormal gait, decreased mobility, difficulty walking, decreased ROM, decreased strength, hypomobility, increased edema, increased fascial restrictions, increased muscle spasms, impaired flexibility, postural dysfunction, obesity, and pain.   ACTIVITY LIMITATIONS standing, squatting, stairs,  transfers, and locomotion level  PARTICIPATION LIMITATIONS: shopping, community activity, occupation, and yard work  PERSONAL FACTORS Age, Behavior pattern, Fitness, Past/current experiences, and Time since onset of injury/illness/exacerbation are also affecting patient's functional outcome.   REHAB POTENTIAL: Good  CLINICAL DECISION MAKING: Stable/uncomplicated  EVALUATION COMPLEXITY: Low   GOALS: Goals reviewed with patient? Yes  SHORT TERM GOALS: Target date: 06/07/2022  Will be compliant with appropriate progressive HEP  Baseline: Goal status: MET  2.  Pain in R foot to be no more than 5/10 and in L foot no more than 3/10  Baseline:  Goal status: IN PROGRESS 06/30/22  3.  Ankle DF ROM to be at least 10 degrees B and inversion/eversion ROM to have improved by at least 10 degrees B  Baseline:  Goal status: IN PROGRESS Eversion met bil 06/30/22  4.  B gastroc flexibility to have improved by 50% B  Baseline:  Goal status: IN PROGRESS 06/30/22    LONG TERM GOALS: Target date: 06/28/2022   MMT to have improved by at least 1 grade in all weak groups  Baseline:  Goal status: IN PROGRESS Met for knee flexion 06/30/22  2.  Will be able to ambulate community distances with pain no more than 4/10 B  Baseline:  Goal status: IN PROGRESS 105/23  3.  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  LEFS score to have improved by at least 15 points to show improvement in function and pain  Baseline: 36/80 eval; 58/80 06/30/22 Goal status: MET 06/30/22    PLAN: PT FREQUENCY: 1-2x/week  PT DURATION: 6 weeks  PLANNED INTERVENTIONS: Therapeutic exercises, Therapeutic activity, Neuromuscular re-education, Balance training, Gait training, Patient/Family education, Self Care, Joint mobilization, Orthotic/Fit training, Aquatic Therapy, Dry Needling, Electrical stimulation,  Cryotherapy, Moist heat, Taping, Ultrasound, Ionotophoresis 65m/ml Dexamethasone, Manual therapy, and Re-evaluation  PLAN FOR NEXT SESSION: Assess ionto and [place] DN to flexor digitorum,  Continue with ankle mobility and stretching, STM , gait training, strengthening, arch strengthening    JMadelyn Flavors PT 06/30/2022, 9:15 AM

## 2022-06-30 ENCOUNTER — Ambulatory Visit: Payer: No Typology Code available for payment source | Admitting: Physical Therapy

## 2022-06-30 ENCOUNTER — Other Ambulatory Visit (HOSPITAL_BASED_OUTPATIENT_CLINIC_OR_DEPARTMENT_OTHER): Payer: Self-pay

## 2022-06-30 ENCOUNTER — Telehealth: Payer: Self-pay

## 2022-06-30 ENCOUNTER — Encounter: Payer: Self-pay | Admitting: Physical Therapy

## 2022-06-30 ENCOUNTER — Other Ambulatory Visit: Payer: Self-pay

## 2022-06-30 DIAGNOSIS — M6281 Muscle weakness (generalized): Secondary | ICD-10-CM

## 2022-06-30 DIAGNOSIS — M79671 Pain in right foot: Secondary | ICD-10-CM

## 2022-06-30 DIAGNOSIS — M25672 Stiffness of left ankle, not elsewhere classified: Secondary | ICD-10-CM

## 2022-06-30 DIAGNOSIS — M25671 Stiffness of right ankle, not elsewhere classified: Secondary | ICD-10-CM

## 2022-06-30 DIAGNOSIS — J302 Other seasonal allergic rhinitis: Secondary | ICD-10-CM

## 2022-06-30 DIAGNOSIS — M79672 Pain in left foot: Secondary | ICD-10-CM

## 2022-06-30 DIAGNOSIS — R262 Difficulty in walking, not elsewhere classified: Secondary | ICD-10-CM

## 2022-06-30 DIAGNOSIS — T7840XA Allergy, unspecified, initial encounter: Secondary | ICD-10-CM

## 2022-06-30 MED ORDER — LEVOCETIRIZINE DIHYDROCHLORIDE 5 MG PO TABS
ORAL_TABLET | Freq: Every evening | ORAL | 2 refills | Status: DC
  Filled 2022-06-30: qty 90, 90d supply, fill #0
  Filled 2022-11-18: qty 90, 90d supply, fill #1
  Filled 2022-11-18: qty 90, 90d supply, fill #0

## 2022-06-30 MED ORDER — MONTELUKAST SODIUM 10 MG PO TABS
ORAL_TABLET | Freq: Every day | ORAL | 2 refills | Status: DC
Start: 2022-06-30 — End: 2023-07-25
  Filled 2022-06-30 – 2022-11-18 (×2): qty 90, 90d supply, fill #0
  Filled 2022-11-18: qty 90, 90d supply, fill #1

## 2022-06-30 MED ORDER — FLUTICASONE PROPIONATE 50 MCG/ACT NA SUSP
2.0000 | Freq: Every day | NASAL | 0 refills | Status: DC
  Filled 2022-06-30: qty 16, 30d supply, fill #0

## 2022-06-30 MED ORDER — AZELASTINE HCL 0.1 % NA SOLN
2.0000 | Freq: Two times a day (BID) | NASAL | 12 refills | Status: DC
  Filled 2022-06-30 – 2022-11-18 (×2): qty 30, 30d supply, fill #0
  Filled 2022-11-18: qty 30, 30d supply, fill #1

## 2022-06-30 NOTE — Telephone Encounter (Signed)
Opened to refill medication °

## 2022-07-04 NOTE — Therapy (Addendum)
OUTPATIENT PHYSICAL THERAPY TREATMENT / [place] SUMMARY   Patient Name: Destiny Lee MRN: [id number] DOB:[birth year], 41 y.o., female Today's Date: 07/05/2022   PT End of Session - 07/05/22 1357     Visit Number 6    Number of Visits 11    Date for PT Re-Evaluation 07/21/22    Authorization Type [facility]    Authorization Time Period 06/30/22 to 07/21/22    PT Start Time 1355    PT Stop Time 1440    PT Time Calculation (min) 45 min    Activity Tolerance Patient tolerated treatment well    Behavior During Therapy [facility] for tasks assessed/performed                  Past Medical History:  Diagnosis Date   Obesity    Past Surgical History:  Procedure Laterality Date   NO PAST SURGERIES     Patient Active Problem List   Diagnosis Date Noted   GAD (generalized anxiety disorder) 08/27/2020   Depression, major, single episode, moderate ([place]) 08/27/2020   Seasonal allergic rhinitis 01/31/2020   Sacroiliac dysfunction 08/07/2019   Morbid obesity ([place]) 08/07/2019   Pityriasis rosea-like skin eruption 07/11/2016    PCP: Shelda Pal   REFERRING PROVIDER: Lorenda Peck, DPM   REFERRING DIAG: M72.2 (ICD-10-CM) - Plantar fasciitis, bilateral   THERAPY DIAG:  Pain in left foot  Pain in right foot  Stiffness of left ankle, not elsewhere classified  Stiffness of right ankle, not elsewhere classified  Difficulty in walking, not elsewhere classified  Muscle weakness (generalized)  Rationale for Evaluation and Treatment Rehabilitation  ONSET DATE: 05/03/2022   SUBJECTIVE:   SUBJECTIVE STATEMENT: Patient reports signficant improvement from right foot pain since last treatment. Pain did not resolve initially but began to decrease a day or two after her session. She was very aggressived with stretching once it started feeling better.   PAIN:  Are you having pain? Yes: NPRS scale: 2/10 in R foot, L foot 0/10 Pain location: B feet  Pain  description: burning, numbness, shooting pains Aggravating factors: standing/walking  Relieving factors: soaking in hot water   PERTINENT HISTORY: 41 y.o. female presents for  follow-up of bilateral plantar fasciitis. Relates it is doing worse since last visit. She has been stretching and icing but nothing helping and the inserts have been painful. She has seen Dr. March Rummage in the past for neuroma pain. Relates numbness along the bottom of both feet for about 3 years. Relates throbbing and aches. Denies any treatments recently.   . Denies any other pedal complaints. Denies n/v/f/c.   PRECAUTIONS: None  WEIGHT BEARING RESTRICTIONS No  FALLS:  Has patient fallen in last 6 months? 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   PLOF: Independent, Independent with basic ADLs, Independent with gait, and Independent with transfers  PATIENT GOALS reduce pain, proper stretching techniques for my feet and legs    OBJECTIVE:   DIAGNOSTIC FINDINGS:   PATIENT SURVEYS:  LEFS 36/80  COGNITION:  Overall cognitive status: Within functional limits for tasks assessed     SENSATION: Not tested  EDEMA:    MUSCLE LENGTH:  HS WNL L, mild limitation R  Piriformis mild limitation L, moderate limitation R   POSTURE: noted B arch drop in stance   PALPATION: B gastrocs tight  and TTP, pain greater R>L at plantar fascia mm insertion R foot, forefeet both tender to touch, plantar fascia muscles tender B with R>L   LOWER EXTREMITY ROM:  Active ROM Right eval Left eval Right 06/14/22 Left 06/14/22 Right 06/30/22 Left 06/30/22  Ankle dorsiflexion -4 -2 -5 2 4/7 -4/5  Ankle plantarflexion 53 57      Ankle inversion 36 40      Ankle eversion '5 2 10 22 '$ 33 29   (Blank rows = not tested)  LOWER EXTREMITY MMT:  MMT  Right eval Left eval Right 06/30/22 Left 06/30/22  Hip flexion 4+ 4+ 4+ 4+  Hip extension 4 4 4+ 5  Hip abduction 4 4 4+ 5  Hip adduction      Hip internal rotation      Hip external rotation      Knee flexion 3+ 3 4+ 4+  Knee extension 5 5    Ankle dorsiflexion 5 5    Ankle plantarflexion      Ankle inversion 5 5    Ankle eversion 5 5     (Blank rows = not tested)   GAIT: Distance walked: antalgic, B arch drop, wide BOS, trendelenburg  Assistive device utilized: None Level of assistance: Complete Independence Comments:     TODAY'S TREATMENT:  07/05/22 Nustep L5x6 min PF stretch with toe on wall 2x30 sec R Gastroc stretch at wall 2x 30 sec R Soleus stretch at wall 2x 30 sec ea Standing hip ABD, ext, march with BTB x 10 ea bil  Modalities:  [place] 1.5 W/cm2 cont x 8 min to right plantar fascia medial Ionotophoresis with 1.0 mL '4mg'$ /ml Dexamethasone - 4-6 hour patch (14m-min) to R plantar fascia (patch #2 of 6)  06/30/22 Bike L3x6 min PF stretch with toe on wall 2x30 sec R Gastroc stretch at wall 2x 30 sec R Soleus stretch at wall 2x 30 sec ea  Therapeutic Activities: MMT, ROM, LEFS completed, discussion of POC  Modalities:  [place]1.5 W/cm2 cont x 8 min to right plantar fascia medial Ionotophoresis with 1.0 mL '4mg'$ /ml Dexamethasone - 4-6 hour patch (849mmin) to R plantar fascia (patch #1 of 6)   06/21/22 Bike L3x6 min Gastroc stretch at wall 2x 30 sec ea Soleus stretch at wall 2x 30 sec ea Toe flexion stretch in kneeling position x 30 sec Seated arch lifts 5 sec hold x 10 Seated fig 4 stretch x 30 sec bil    Manual Therapy:   Skilled palpation and monitoring of soft tissues during DN STM and deep MFR to right plantar fascia, ABD hallucis and R gastroc     Trigger Point Dry-Needling  Treatment instructions: Expect mild to moderate muscle soreness. S/S of pneumothorax if dry needled over a lung field, and to seek immediate medical attention should they occur.  Patient verbalized understanding of these instructions and education.  Patient Consent Given: Yes Education handout provided: Previously provided Muscles treated: R quadratus plantae, ABD hallucis, R lateral gastroc Electrical stimulation performed: No Parameters: N/A Treatment response/outcome: Twitch Response Elicited and Palpable Increase in Muscle Length   06/14/22 Bike L2x6 min Gastroc stretch at wall 2x 30 sec ea Soleus stretch at wall 2x 30 sec ea Toe flexion stretch in kneeling position x 30 sec Seated arch lifts 5 sec hold x 10    MT: STM to bil plantar fascia and medial foot, also lateral gastroc R Cuboid mobs bil Measurements taken   06/01/22 Bike L2x6m53mAnkle circles x 10 bil  Ankle ABC x 2 bil Gastroc bil 2x30 sec with strap Toe curls x 10 bil  MT: STM to bil plantar fascia and medial foot  Nustep L3 x6 minutes BLEs only   Ankle circles CW/CCW x10 B  Ankle alphabet x1 B  Gastroc stretch on door frame x30 seconds B    PATIENT EDUCATION  Education details: HEP update 07/05/22 Person educated: Patient Education method: Consulting civil engineer, Demonstration, Verbal cues, and Handouts Education comprehension: verbalized understanding and returned demonstration   HOME EXERCISE PROGRAM: Access Code: [id number]  ASSESSMENT:  CLINICAL IMPRESSION: Chevella Rubi presents today with decreased pain in the right plantar fascia rating it 2/10 down from 5/10. She was able to walk barefoot briefly at home without pain but did not "push it". We progressed her HEP today with hip strengthening to address unmet LTG. Second trial of [place] and ionto patch applied today as well. Destiny Lee continues to demonstrate potential for improvement and would benefit from continued skilled therapy to address impairments.     OBJECTIVE IMPAIRMENTS Abnormal gait, decreased mobility, difficulty walking, decreased ROM, decreased strength, hypomobility, increased edema, increased fascial restrictions,  increased muscle spasms, impaired flexibility, postural dysfunction, obesity, and pain.   ACTIVITY LIMITATIONS standing, squatting, stairs, transfers, and locomotion level  PARTICIPATION LIMITATIONS: shopping, community activity, occupation, and yard work  PERSONAL FACTORS Age, Behavior pattern, Fitness, Past/current experiences, and Time since onset of injury/illness/exacerbation are also affecting patient's functional outcome.   REHAB POTENTIAL: Good  CLINICAL DECISION MAKING: Stable/uncomplicated  EVALUATION COMPLEXITY: Low   GOALS: Goals reviewed with patient? Yes  SHORT TERM GOALS: Target date: 06/07/2022  Will be compliant with appropriate progressive HEP  Baseline: Goal status: MET  2.  Pain in R foot to be no more than 5/10 and in L foot no more than 3/10  Baseline:  Goal status: IN PROGRESS 06/30/22  3.  Ankle DF ROM to be at least 10 degrees B and inversion/eversion ROM to have improved by at least 10 degrees B  Baseline:  Goal status: IN PROGRESS Eversion met bil 06/30/22  4.  B gastroc flexibility to have improved by 50% B  Baseline:  Goal status: IN PROGRESS 06/30/22    LONG TERM GOALS: Target date: 06/28/2022 extended to 07/21/22  MMT to have improved by at least 1 grade in all weak groups  Baseline:  Goal status: IN PROGRESS Met for knee flexion 06/30/22  2.  Will be able to ambulate community distances with pain no more than 4/10 B  Baseline:  Goal status: IN PROGRESS 105/23  3.  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  LEFS score to have improved by at least 15 points to show improvement in function and pain  Baseline: 36/80 eval; 58/80 06/30/22 Goal status: MET 06/30/22    PLAN: PT FREQUENCY: 1-2x/week  PT DURATION: 6 weeks  PLANNED INTERVENTIONS: Therapeutic exercises, Therapeutic activity, Neuromuscular re-education,  Balance training, Gait training, Patient/Family education, Self Care, Joint mobilization, Orthotic/Fit training, Aquatic Therapy, Dry Needling, Electrical stimulation, Cryotherapy, Moist heat, Taping, Ultrasound, Ionotophoresis '4mg'$ /ml Dexamethasone, Manual therapy, and Re-evaluation  PLAN FOR NEXT SESSION: Assess ionto #2 and [place]. Possible DN to flexor digitorum if indicated.  Continue with ankle mobility and stretching, STM , gait training, strengthening, arch strengthening    Madelyn Flavors, PT 07/05/2022, 2:50 PM    PHYSICAL THERAPY DISCHARGE SUMMARY  Visits from Start of  Care: 6  Current functional level related to goals / functional outcomes:   Refer to above clinical impression and goal assessment for status as of last visit on 07/05/22. Patient cancelled and no showed for last 2 scheduled visits and has not returned to PT, therefore will proceed with discharge from PT for this episode.     Remaining deficits:   As above. Unable to formally assess status at discharge due to failure to return to PT.   Education / Equipment:   HEP   Patient agrees to discharge. Patient goals were partially met. Patient is being discharged due to not returning since the last visit.  Percival Spanish, PT, MPT 11/28/22, 2:24 PM  [facility] [place], [place], [address] Phone: [phone]   Fax:  [phone]

## 2022-07-05 ENCOUNTER — Encounter: Payer: Self-pay | Admitting: Physical Therapy

## 2022-07-05 ENCOUNTER — Ambulatory Visit: Payer: No Typology Code available for payment source | Admitting: Physical Therapy

## 2022-07-05 DIAGNOSIS — M79672 Pain in left foot: Secondary | ICD-10-CM

## 2022-07-05 DIAGNOSIS — M25672 Stiffness of left ankle, not elsewhere classified: Secondary | ICD-10-CM

## 2022-07-05 DIAGNOSIS — R262 Difficulty in walking, not elsewhere classified: Secondary | ICD-10-CM

## 2022-07-05 DIAGNOSIS — M6281 Muscle weakness (generalized): Secondary | ICD-10-CM

## 2022-07-05 DIAGNOSIS — M25671 Stiffness of right ankle, not elsewhere classified: Secondary | ICD-10-CM

## 2022-07-05 DIAGNOSIS — M79671 Pain in right foot: Secondary | ICD-10-CM

## 2022-07-20 ENCOUNTER — Ambulatory Visit: Payer: No Typology Code available for payment source

## 2022-07-29 ENCOUNTER — Ambulatory Visit: Payer: No Typology Code available for payment source | Admitting: Physical Therapy

## 2022-08-09 ENCOUNTER — Ambulatory Visit: Payer: No Typology Code available for payment source | Attending: Podiatry

## 2022-09-23 ENCOUNTER — Other Ambulatory Visit (HOSPITAL_BASED_OUTPATIENT_CLINIC_OR_DEPARTMENT_OTHER): Payer: Self-pay

## 2022-09-30 ENCOUNTER — Other Ambulatory Visit (HOSPITAL_BASED_OUTPATIENT_CLINIC_OR_DEPARTMENT_OTHER): Payer: Self-pay

## 2022-10-08 ENCOUNTER — Other Ambulatory Visit (HOSPITAL_BASED_OUTPATIENT_CLINIC_OR_DEPARTMENT_OTHER): Payer: Self-pay

## 2022-10-12 ENCOUNTER — Other Ambulatory Visit (HOSPITAL_BASED_OUTPATIENT_CLINIC_OR_DEPARTMENT_OTHER): Payer: Self-pay

## 2022-11-18 ENCOUNTER — Other Ambulatory Visit (HOSPITAL_BASED_OUTPATIENT_CLINIC_OR_DEPARTMENT_OTHER): Payer: Self-pay

## 2022-11-18 ENCOUNTER — Other Ambulatory Visit: Payer: Self-pay

## 2022-11-18 ENCOUNTER — Other Ambulatory Visit (HOSPITAL_COMMUNITY): Payer: Self-pay

## 2022-11-18 ENCOUNTER — Other Ambulatory Visit: Payer: Self-pay | Admitting: Family Medicine

## 2022-11-18 ENCOUNTER — Telehealth: Payer: Self-pay

## 2022-11-18 DIAGNOSIS — J302 Other seasonal allergic rhinitis: Secondary | ICD-10-CM

## 2022-11-18 MED ORDER — FLUTICASONE PROPIONATE 50 MCG/ACT NA SUSP
2.0000 | Freq: Every day | NASAL | 1 refills | Status: DC
  Filled 2022-11-18 – 2022-11-23 (×2): qty 16, 30d supply, fill #0
  Filled 2023-07-25: qty 16, 30d supply, fill #1

## 2022-11-18 MED ORDER — WEGOVY 0.5 MG/0.5ML ~~LOC~~ SOAJ
0.5000 mg | SUBCUTANEOUS | 0 refills | Status: AC
  Filled 2022-11-18 – 2022-11-23 (×2): qty 2, 28d supply, fill #0

## 2022-11-18 NOTE — Telephone Encounter (Signed)
PA initiated via Covermymeds; KEY: [id number]. Awaiting determination.

## 2022-11-21 ENCOUNTER — Other Ambulatory Visit (HOSPITAL_BASED_OUTPATIENT_CLINIC_OR_DEPARTMENT_OTHER): Payer: Self-pay

## 2022-11-21 NOTE — Telephone Encounter (Signed)
PA approved. Was able to get plan to accept new/initial therapy PA d/t problems w/ supply.   The request has been approved. The authorization is effective from 11/21/2022 to 06/12/2023, as long as the member is enrolled in their current health plan.. Authorization Expiration Date: June 12, 2023.

## 2022-11-21 NOTE — Telephone Encounter (Signed)
Patient informed. 

## 2022-11-23 ENCOUNTER — Other Ambulatory Visit (HOSPITAL_BASED_OUTPATIENT_CLINIC_OR_DEPARTMENT_OTHER): Payer: Self-pay

## 2022-11-23 ENCOUNTER — Other Ambulatory Visit: Payer: Self-pay

## 2022-11-23 ENCOUNTER — Other Ambulatory Visit (HOSPITAL_COMMUNITY): Payer: Self-pay

## 2022-12-07 ENCOUNTER — Encounter: Payer: Self-pay | Admitting: Family Medicine

## 2022-12-07 ENCOUNTER — Ambulatory Visit (INDEPENDENT_AMBULATORY_CARE_PROVIDER_SITE_OTHER): Payer: 59 | Admitting: Family Medicine

## 2022-12-07 VITALS — BP 120/80 | HR 82 | Temp 98.2°F | Ht 65.0 in | Wt 316.0 lb

## 2022-12-07 DIAGNOSIS — Z0000 Encounter for general adult medical examination without abnormal findings: Secondary | ICD-10-CM

## 2022-12-07 LAB — LIPID PANEL
Cholesterol: 203 mg/dL — ABNORMAL HIGH (ref 0–200)
HDL: 47.5 mg/dL (ref >39.00)
LDL Cholesterol: 140 mg/dL — ABNORMAL HIGH (ref 0–99)
NonHDL: 155.09
Total CHOL/HDL Ratio: 4
Triglycerides: 74 mg/dL (ref 0.0–149.0)
VLDL: 14.8 mg/dL (ref 0.0–40.0)

## 2022-12-07 LAB — CBC
HCT: 40.3 % (ref 36.0–46.0)
Hemoglobin: 13.5 g/dL (ref 12.0–15.0)
MCHC: 33.5 g/dL (ref 30.0–36.0)
MCV: 95 fl (ref 78.0–100.0)
Platelets: 215 10*3/uL (ref 150.0–400.0)
RBC: 4.24 Mil/uL (ref 3.87–5.11)
RDW: 13.7 % (ref 11.5–15.5)
WBC: 5.6 10*3/uL (ref 4.0–10.5)

## 2022-12-07 LAB — COMPREHENSIVE METABOLIC PANEL
ALT: 17 U/L (ref 0–35)
AST: 13 U/L (ref 0–37)
Albumin: 4 g/dL (ref 3.5–5.2)
Alkaline Phosphatase: 43 U/L (ref 39–117)
BUN: 13 mg/dL (ref 6–23)
CO2: 26 mEq/L (ref 19–32)
Calcium: 8.9 mg/dL (ref 8.4–10.5)
Chloride: 104 mEq/L (ref 96–112)
Creatinine, Ser: 0.75 mg/dL (ref 0.40–1.20)
GFR: 98.19 mL/min (ref >60.00)
Glucose, Bld: 80 mg/dL (ref 70–99)
Potassium: 3.8 mEq/L (ref 3.5–5.1)
Sodium: 137 mEq/L (ref 135–145)
Total Bilirubin: 0.5 mg/dL (ref 0.2–1.2)
Total Protein: 7.1 g/dL (ref 6.0–8.3)

## 2022-12-07 NOTE — Progress Notes (Signed)
Chief Complaint  Patient presents with   Annual Exam     Well Woman Destiny Lee is here for a complete physical.   Her last physical was >1 year ago.  Current diet: in general, diet has improved. Current exercise: cycling, walking, some wt resistance. Weight is stable and she denies fatigue out of ordinary. Seatbelt? Yes Advanced directive? No  Health Maintenance Pap/HPV- Yes Mammogram- Yes Tetanus- Yes Hep C screening- Yes HIV screening- Yes  Past Medical History:  Diagnosis Date   Obesity      Past Surgical History:  Procedure Laterality Date   NO PAST SURGERIES      Medications  Current Outpatient Medications on File Prior to Visit  Medication Sig Dispense Refill   azelastine (ASTELIN) 0.1 % nasal spray Place 2 sprays into both nostrils 2 (two) times daily. Use in each nostril as directed 30 mL 12   dicyclomine (BENTYL) 10 MG capsule Take 1 tab every 6 hours as needed for abdominal cramping. 60 capsule 0   fluticasone (FLONASE) 50 MCG/ACT nasal spray Place 2 sprays into both nostrils daily. 16 g 1   levocetirizine (XYZAL) 5 MG tablet TAKE 1 TABLET (5 MG TOTAL) BY MOUTH EVERY EVENING. 90 tablet 2   levonorgestrel (MIRENA) 20 MCG/24HR IUD 1 each by Intrauterine route once.     meloxicam (MOBIC) 15 MG tablet Take 1 tablet (15 mg total) by mouth daily. 30 tablet 0   montelukast (SINGULAIR) 10 MG tablet TAKE 1 TABLET (10 MG TOTAL) BY MOUTH AT BEDTIME. 90 tablet 2   Semaglutide-Weight Management (WEGOVY) 0.5 MG/0.5ML SOAJ Inject 0.5 mg into the skin once a week for 28 days. 2 mL 0    Allergies No Known Allergies  Review of Systems: Constitutional:  no unexpected weight changes Eye:  no recent significant change in vision Ear/Nose/Mouth/Throat:  Ears:  no recent change in hearing Nose/Mouth/Throat:  no complaints of nasal congestion, no sore throat Cardiovascular: no chest pain Respiratory:  no shortness of breath Gastrointestinal:  no abdominal pain, no change in  bowel habits GU:  Female: negative for dysuria or pelvic pain Musculoskeletal/Extremities:  no new pain of the joints Integumentary (Skin/Breast):  no abnormal skin lesions reported Neurologic:  no headaches Endocrine:  denies fatigue Hematologic/Lymphatic:  No areas of easy bleeding  Exam BP 120/80 (BP Location: Left Arm, Patient Position: Sitting, Cuff Size: Normal)   Pulse 82   Temp 98.2 F (36.8 C) (Oral)   Ht '5\' 5"'$  (1.651 m)   Wt (!) 316 lb (143.3 kg)   SpO2 98%   BMI 52.59 kg/m  General:  well developed, well nourished, in no apparent distress Skin:  no significant moles, warts, or growths Head:  no masses, lesions, or tenderness Eyes:  pupils equal and round, sclera anicteric without injection Ears:  canals without lesions, TMs shiny without retraction, no obvious effusion, no erythema Nose:  nares patent, mucosa normal, and no drainage Throat/Pharynx:  lips and gingiva without lesion; tongue and uvula midline; non-inflamed pharynx; no exudates or postnasal drainage Neck: neck supple without adenopathy, thyromegaly, or masses Lungs:  clear to auscultation, breath sounds equal bilaterally, no respiratory distress Cardio:  regular rate and rhythm, no LE edema Abdomen:  abdomen soft, nontender; bowel sounds normal; no masses or organomegaly Genital: Defer to GYN Musculoskeletal:  symmetrical muscle groups noted without atrophy or deformity Extremities:  no clubbing, cyanosis, or edema, no deformities, no skin discoloration Neuro:  gait normal; deep tendon reflexes normal and symmetric Psych: well  oriented with normal range of affect and appropriate judgment/insight  Assessment and Plan  Well adult exam - Plan: CBC, Comprehensive metabolic panel, Lipid panel   Well 42 y.o. female. Counseled on diet and exercise. Advanced directive form provided today.  Other orders as above. Follow up in 6 mo or prn. The patient voiced understanding and agreement to the  plan.  [place], DO 12/07/22 9:01 AM

## 2022-12-07 NOTE — Patient Instructions (Signed)
Give us 2-3 business days to get the results of your labs back.   Keep the diet clean and stay active.  Please get me a copy of your advanced directive form at your convenience.   Let us know if you need anything.  

## 2023-01-09 ENCOUNTER — Other Ambulatory Visit: Payer: Self-pay | Admitting: Family Medicine

## 2023-01-09 ENCOUNTER — Other Ambulatory Visit (HOSPITAL_BASED_OUTPATIENT_CLINIC_OR_DEPARTMENT_OTHER): Payer: Self-pay

## 2023-01-09 MED ORDER — SEMAGLUTIDE-WEIGHT MANAGEMENT 1 MG/0.5ML ~~LOC~~ SOAJ
1.0000 mg | SUBCUTANEOUS | 0 refills | Status: DC
  Filled 2023-01-09: qty 2, 28d supply, fill #0

## 2023-01-10 ENCOUNTER — Other Ambulatory Visit (HOSPITAL_BASED_OUTPATIENT_CLINIC_OR_DEPARTMENT_OTHER): Payer: Self-pay

## 2023-01-20 ENCOUNTER — Other Ambulatory Visit: Payer: Self-pay

## 2023-02-23 LAB — AMB RESULTS CONSOLE CBG: Glucose: 116

## 2023-05-03 ENCOUNTER — Other Ambulatory Visit: Payer: Self-pay | Admitting: Family Medicine

## 2023-05-03 MED ORDER — FLUCONAZOLE 150 MG PO TABS: ORAL_TABLET | ORAL | 0 refills | Status: DC

## 2023-05-05 ENCOUNTER — Telehealth (INDEPENDENT_AMBULATORY_CARE_PROVIDER_SITE_OTHER): Payer: 59 | Admitting: Family Medicine

## 2023-05-05 ENCOUNTER — Encounter: Payer: Self-pay | Admitting: Family Medicine

## 2023-05-05 DIAGNOSIS — R238 Other skin changes: Secondary | ICD-10-CM | POA: Diagnosis not present

## 2023-05-05 MED ORDER — TRIAMCINOLONE ACETONIDE 0.1 % EX CREA: 1.0000 | TOPICAL_CREAM | Freq: Two times a day (BID) | CUTANEOUS | 0 refills | Status: DC

## 2023-05-05 NOTE — Progress Notes (Signed)
Chief Complaint  Patient presents with   Allergic Reaction    Destiny Lee is a 42 y.o. female here for a skin complaint. We are interacting via web portal for an electronic face-to-face visit. I verified patient's ID using 2 identifiers. Patient agreed to proceed with visit via this method. Patient is at home, I am at office. Patient and I are present for visit.   Duration: 4 days Location: genital region, inner thighs Pruritic? At first, not currently Painful? Yes- irritated Drainage? No New soaps/lotions/topicals/detergents? Yes- went to bar soap from liquid and detergents Sick contacts? No Other associated symptoms: no fevers, urinary complaints Therapies tried thus far: Azo, probiotic  Past Medical History:  Diagnosis Date   Obesity    Objective No conversational dyspnea Age appropriate judgment and insight Nml affect and mood  Skin irritation - Plan: triamcinolone cream (KENALOG) 0.1 %  Resume previous detergents and soaps.  Try not to scratch as this can make things worse.  Topical cream as above as needed.  She has Diflucan at the pharmacy should she need it.  Expect improvement in the next week. F/u prn. The patient voiced understanding and agreement to the plan.  [name] [place], DO 05/05/23 10:30 AM

## 2023-07-24 ENCOUNTER — Other Ambulatory Visit (HOSPITAL_BASED_OUTPATIENT_CLINIC_OR_DEPARTMENT_OTHER): Payer: Self-pay | Admitting: Family Medicine

## 2023-07-24 DIAGNOSIS — Z1231 Encounter for screening mammogram for malignant neoplasm of breast: Secondary | ICD-10-CM

## 2023-07-25 ENCOUNTER — Other Ambulatory Visit (HOSPITAL_BASED_OUTPATIENT_CLINIC_OR_DEPARTMENT_OTHER): Payer: Self-pay

## 2023-07-25 ENCOUNTER — Encounter (HOSPITAL_BASED_OUTPATIENT_CLINIC_OR_DEPARTMENT_OTHER): Payer: Self-pay

## 2023-07-25 ENCOUNTER — Ambulatory Visit (HOSPITAL_BASED_OUTPATIENT_CLINIC_OR_DEPARTMENT_OTHER)
Admission: RE | Admit: 2023-07-25 | Discharge: 2023-07-25 | Disposition: A | Discharge status: Home / Self Care | Payer: 59 | Source: Ambulatory Visit | Attending: Family Medicine | Admitting: Family Medicine

## 2023-07-25 ENCOUNTER — Other Ambulatory Visit: Payer: Self-pay | Admitting: Family Medicine

## 2023-07-25 ENCOUNTER — Ambulatory Visit: Payer: 59

## 2023-07-25 DIAGNOSIS — Z1231 Encounter for screening mammogram for malignant neoplasm of breast: Secondary | ICD-10-CM | POA: Diagnosis not present

## 2023-07-25 DIAGNOSIS — J302 Other seasonal allergic rhinitis: Secondary | ICD-10-CM

## 2023-07-25 MED ORDER — MONTELUKAST SODIUM 10 MG PO TABS
10.0000 mg | ORAL_TABLET | Freq: Every day | ORAL | 2 refills | Status: DC
  Filled 2023-07-25: qty 30, 30d supply, fill #0

## 2023-07-25 MED ORDER — LEVOCETIRIZINE DIHYDROCHLORIDE 5 MG PO TABS
5.0000 mg | ORAL_TABLET | Freq: Every evening | ORAL | 2 refills | Status: DC
  Filled 2023-07-25: qty 30, 30d supply, fill #0

## 2023-07-25 MED ORDER — FLULAVAL 0.5 ML IM SUSY
0.5000 mL | PREFILLED_SYRINGE | Freq: Once | INTRAMUSCULAR | 0 refills | Status: AC
  Filled 2023-07-25: qty 0.5, 1d supply, fill #0

## 2023-08-01 ENCOUNTER — Encounter: Payer: Self-pay | Admitting: Family Medicine

## 2023-08-01 ENCOUNTER — Ambulatory Visit (INDEPENDENT_AMBULATORY_CARE_PROVIDER_SITE_OTHER): Payer: 59 | Admitting: Family Medicine

## 2023-08-01 ENCOUNTER — Other Ambulatory Visit (HOSPITAL_BASED_OUTPATIENT_CLINIC_OR_DEPARTMENT_OTHER): Payer: Self-pay

## 2023-08-01 VITALS — BP 128/76 | HR 99 | Temp 98.0°F | Resp 16 | Ht 65.0 in | Wt 316.0 lb

## 2023-08-01 DIAGNOSIS — W57XXXA Bitten or stung by nonvenomous insect and other nonvenomous arthropods, initial encounter: Secondary | ICD-10-CM | POA: Diagnosis not present

## 2023-08-01 DIAGNOSIS — J302 Other seasonal allergic rhinitis: Secondary | ICD-10-CM | POA: Diagnosis not present

## 2023-08-01 DIAGNOSIS — S80862A Insect bite (nonvenomous), left lower leg, initial encounter: Secondary | ICD-10-CM

## 2023-08-01 MED ORDER — LEVOCETIRIZINE DIHYDROCHLORIDE 5 MG PO TABS
5.0000 mg | ORAL_TABLET | Freq: Every evening | ORAL | Status: DC
Start: 2023-08-01 — End: 2023-10-27

## 2023-08-01 MED ORDER — TRIAMCINOLONE ACETONIDE 0.1 % EX CREA
1.0000 | TOPICAL_CREAM | Freq: Two times a day (BID) | CUTANEOUS | 0 refills | Status: DC
Start: 2023-08-01 — End: 2024-07-02
  Filled 2023-08-01: qty 30, 15d supply, fill #0

## 2023-08-01 MED ORDER — MONTELUKAST SODIUM 10 MG PO TABS: 10.0000 mg | ORAL_TABLET | Freq: Every day | ORAL | Status: AC

## 2023-08-01 NOTE — Progress Notes (Signed)
Chief Complaint  Patient presents with   Insect Bite    Insect bite left foot    Destiny Lee is a 42 y.o. female here for a skin complaint.  Duration: 3 days Location: LLE Pruritic? Yes Painful? Yes Drainage? No New soaps/lotions/topicals/detergents? No Other associated symptoms: swelling; no redness or swelling Therapies tried thus far: Xyzal, Singulair did help  Past Medical History:  Diagnosis Date   Obesity     BP 128/76 (BP Location: Left Arm, Patient Position: Sitting, Cuff Size: Large)   Pulse 99   Temp 98 F (36.7 C) (Oral)   Resp 16   Ht 5\' 5"  (1.651 m)   Wt (!) 316 lb (143.3 kg)   SpO2 99%   BMI 52.59 kg/m  Gen: awake, alert, appearing stated age Lungs: No accessory muscle use Skin: overlying edema on lateral LLE with mild ttp. Scattered excoriations noted. No drainage, erythema, fluctuance Psych: Age appropriate judgment and insight  Insect bite of left lower leg, initial encounter - Plan: triamcinolone cream (KENALOG) 0.1 %  Seasonal allergic rhinitis, unspecified trigger - Plan: levocetirizine (XYZAL) 5 MG tablet  Xyzal+Singulair as there appears to be a histamine response. Kenalog cream as above. With intense itching and improvement of pain, suspect this rather than infection. She will let me know if she fails to improve.  F/u prn. The patient voiced understanding and agreement to the plan.  [name] [place], DO 08/01/23 1:25 PM

## 2023-08-01 NOTE — Patient Instructions (Signed)
Try not to scratch as this can make things worse. Avoid scented products while dealing with this. You may resume when the itchiness resolves. Cold/cool compresses can help.   Take your Singulair and Xyzal daily.   Send me a message or text/call in the next 36 hrs and no improvement.   Let us know if you need anything.

## 2023-08-03 ENCOUNTER — Other Ambulatory Visit: Payer: Self-pay | Admitting: Family Medicine

## 2023-08-03 ENCOUNTER — Other Ambulatory Visit (HOSPITAL_BASED_OUTPATIENT_CLINIC_OR_DEPARTMENT_OTHER): Payer: Self-pay

## 2023-08-03 ENCOUNTER — Other Ambulatory Visit: Payer: Self-pay

## 2023-08-03 MED ORDER — DOXYCYCLINE HYCLATE 100 MG PO TABS
100.0000 mg | ORAL_TABLET | Freq: Two times a day (BID) | ORAL | 0 refills | Status: DC
  Filled 2023-08-03: qty 14, 7d supply, fill #0

## 2023-08-03 MED ORDER — DOXYCYCLINE HYCLATE 100 MG PO TABS: 100.0000 mg | ORAL_TABLET | Freq: Two times a day (BID) | ORAL | 0 refills | Status: DC

## 2023-08-03 NOTE — Progress Notes (Signed)
Contact by pt stating the topical steroid and antihistamines are no longer working. Pain and swelling present. Will send in 7 d of doxy. She will keep me updated.

## 2023-08-08 ENCOUNTER — Other Ambulatory Visit: Payer: Self-pay

## 2023-08-08 MED ORDER — DOXYCYCLINE HYCLATE 100 MG PO TABS: 100.0000 mg | ORAL_TABLET | Freq: Two times a day (BID) | ORAL | 0 refills | Status: AC

## 2023-08-08 MED ORDER — FLUCONAZOLE 150 MG PO TABS: 150.0000 mg | ORAL_TABLET | Freq: Every day | ORAL | 0 refills | Status: DC

## 2023-09-26 ENCOUNTER — Other Ambulatory Visit: Payer: Self-pay | Admitting: Family Medicine

## 2023-09-26 MED ORDER — MELOXICAM 15 MG PO TABS: 15.0000 mg | ORAL_TABLET | Freq: Every day | ORAL | 0 refills | Status: DC

## 2023-10-04 IMAGING — MG MM DIGITAL SCREENING BILAT W/ TOMO AND CAD
8 of 14 series · 8 of 40 positions shown · non-contrast
Comparison: None.

CLINICAL DATA: Screening.

EXAM:
DIGITAL SCREENING BILATERAL MAMMOGRAM WITH TOMOSYNTHESIS AND CAD
TECHNIQUE: Bilateral screening digital craniocaudal and mediolateral oblique
mammograms were obtained. Bilateral screening digital breast
tomosynthesis was performed. The images were evaluated with
computer-aided detection.

[L MLO synth-2D (1 of 2)]
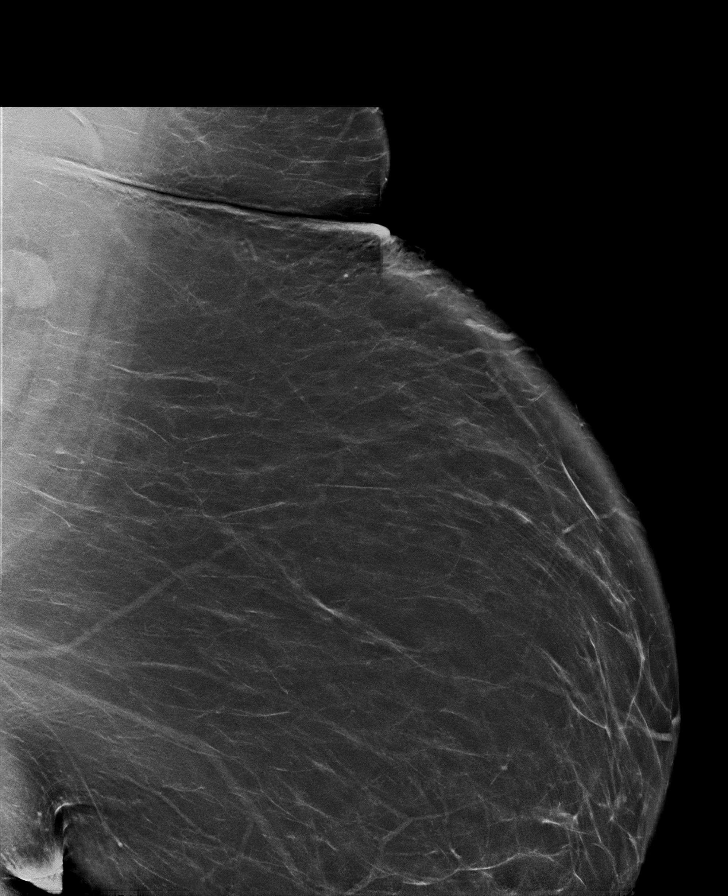

[R CC synth-2D (1 of 2)]
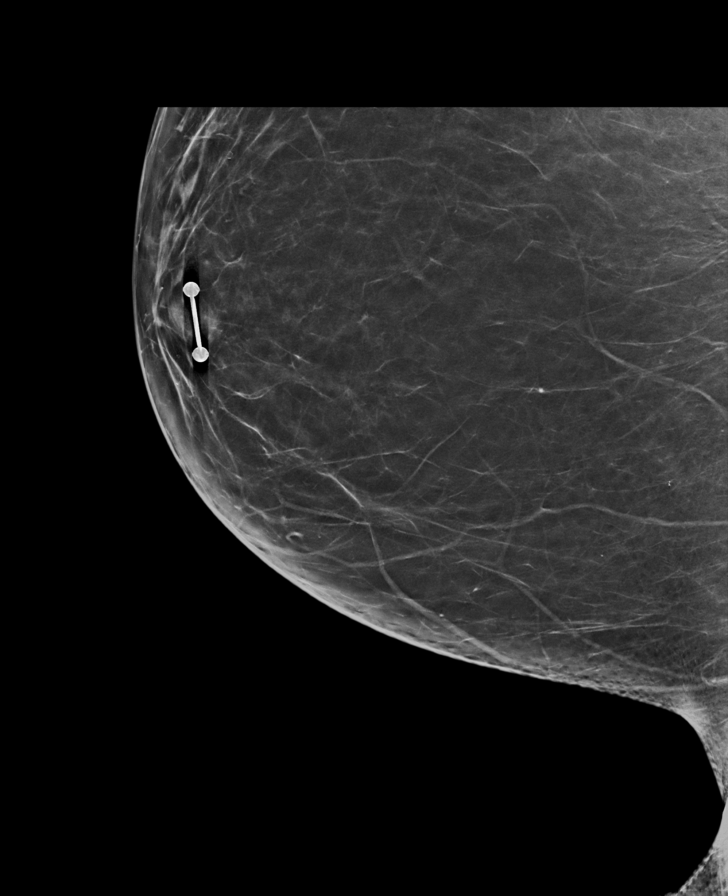

[L CC synth-2D (1 of 2)]
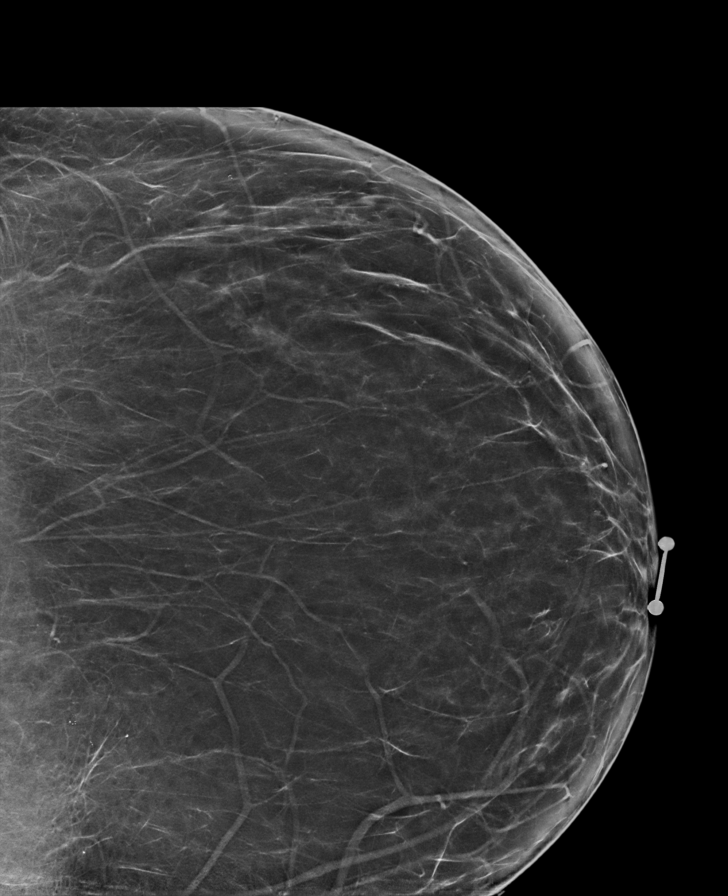

[R MLO synth-2D]
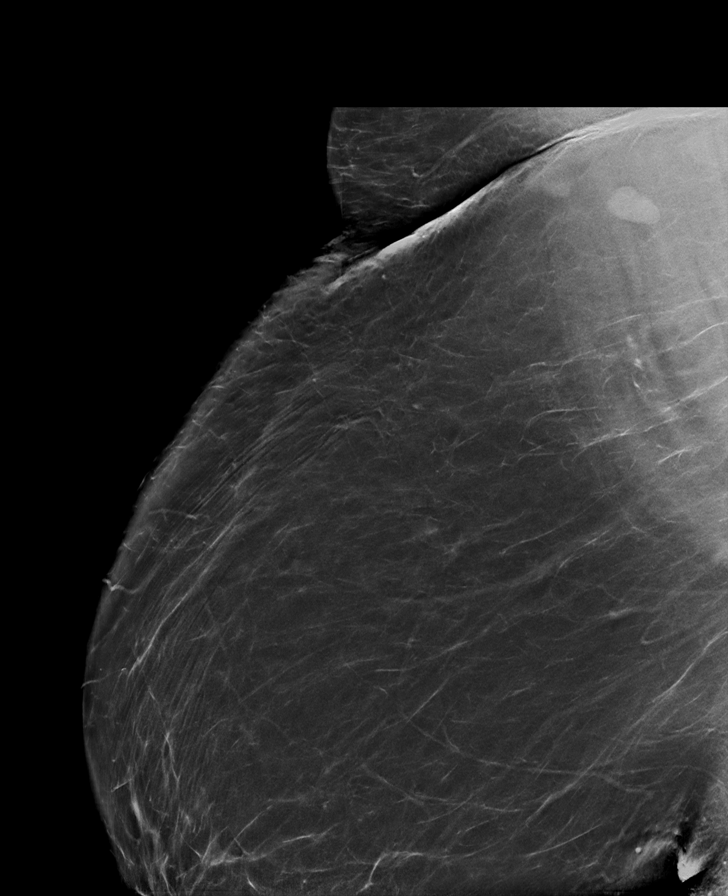

[L MLO synth-2D (2 of 2)]
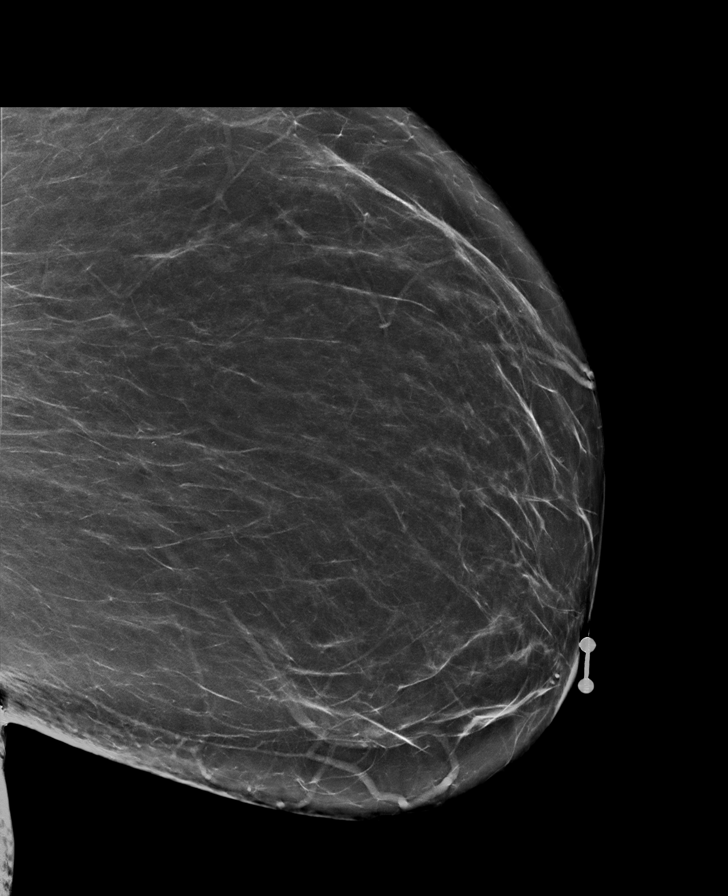

[L CC synth-2D (2 of 2)]
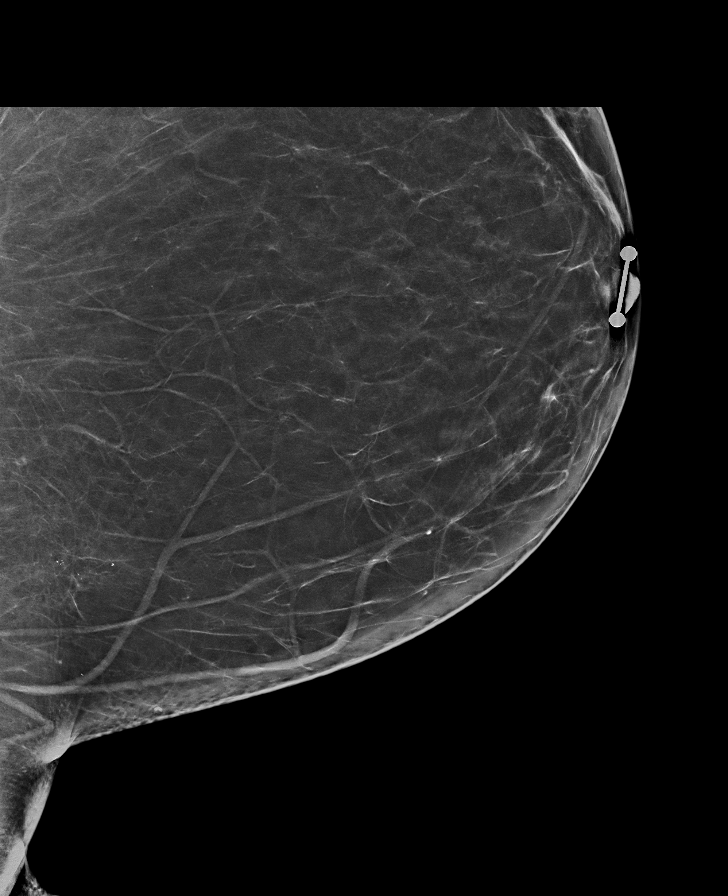

[R CC synth-2D (2 of 2)]
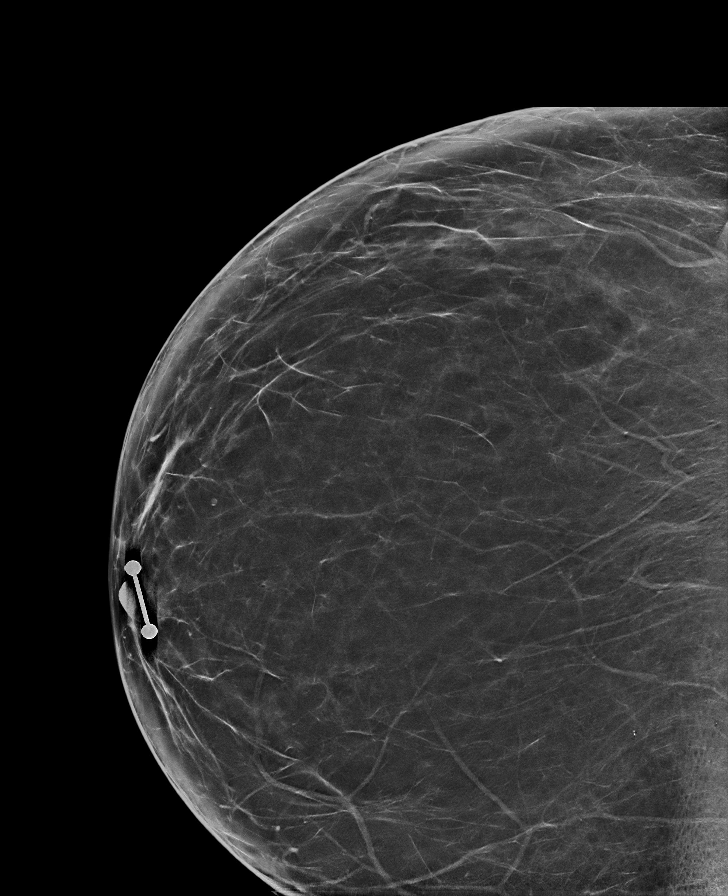

[R CC tomo · tomo slice 41/81.0]
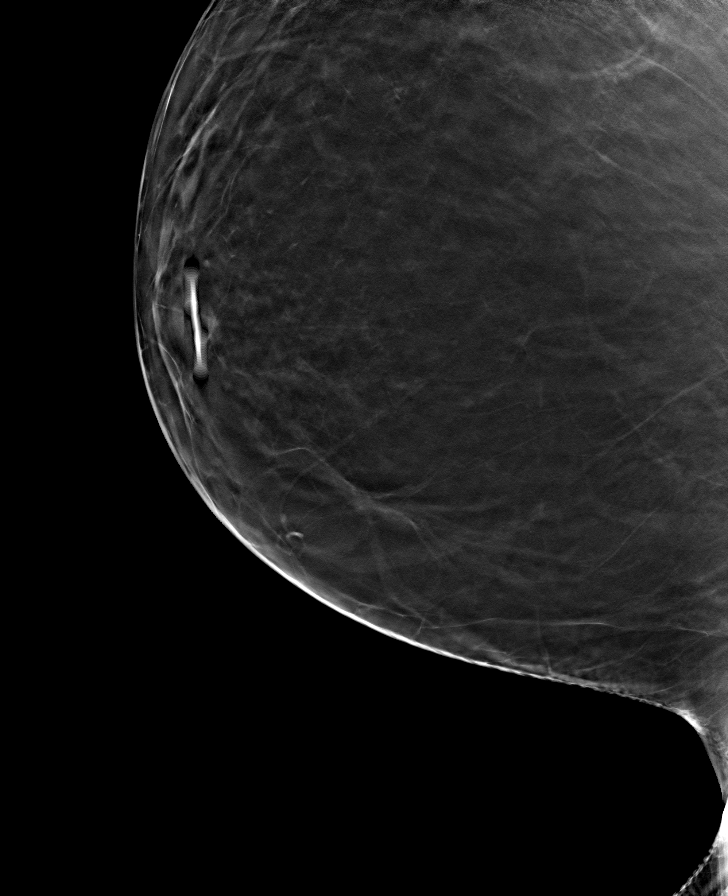

[8 of 40 positions shown; findings below may reference images not displayed]

ACR Breast Density Category b: There are scattered areas of
fibroglandular density.
FINDINGS: There are no findings suspicious for malignancy.
IMPRESSION: No mammographic evidence of malignancy. A result letter of this
screening mammogram will be mailed directly to the patient.

RECOMMENDATION:
Screening mammogram in one year. (Code:XG-[id number])

BI-RADS CATEGORY  1: Negative.

## 2023-10-27 ENCOUNTER — Ambulatory Visit: Payer: Commercial Managed Care - PPO | Admitting: Family Medicine

## 2023-10-27 ENCOUNTER — Encounter: Payer: Self-pay | Admitting: Family Medicine

## 2023-10-27 ENCOUNTER — Other Ambulatory Visit (HOSPITAL_BASED_OUTPATIENT_CLINIC_OR_DEPARTMENT_OTHER): Payer: Self-pay

## 2023-10-27 VITALS — BP 126/80 | HR 82 | Temp 98.0°F | Resp 16 | Ht 65.0 in | Wt 316.0 lb

## 2023-10-27 DIAGNOSIS — J029 Acute pharyngitis, unspecified: Secondary | ICD-10-CM

## 2023-10-27 LAB — POCT RAPID STREP A (OFFICE): Rapid Strep A Screen: NEGATIVE

## 2023-10-27 MED ORDER — METHYLPREDNISOLONE ACETATE 80 MG/ML IJ SUSP
80.0000 mg | Freq: Once | INTRAMUSCULAR | Status: AC
  Administered 2023-10-27: 80 mg via INTRAMUSCULAR

## 2023-10-27 MED ORDER — FEXOFENADINE HCL 180 MG PO TABS
180.0000 mg | ORAL_TABLET | Freq: Every day | ORAL | 5 refills | Status: AC
  Filled 2023-10-27: qty 30, 30d supply, fill #0

## 2023-10-27 NOTE — Progress Notes (Signed)
Chief Complaint  Patient presents with   Sore Throat    Sore Throat     Destiny Lee here for URI complaints.  Duration: 4 days  Associated symptoms:  scratchy/irritated throat , itchy eyes, sneezing Denies: sinus headache, sinus congestion, sinus pain, rhinorrhea, watery eyes, ear pain, ear drainage, wheezing, shortness of breath, myalgia, and fevers Treatment to date: ginger, vit C Sick contacts: No  Past Medical History:  Diagnosis Date   Obesity     Objective BP 126/80   Pulse 82   Temp 98 F (36.7 C) (Oral)   Resp 16   Ht 5\' 5"  (1.651 m)   Wt (!) 316 lb (143.3 kg)   SpO2 98%   BMI 52.59 kg/m  General: Awake, alert, appears stated age HEENT: AT, [place], ears patent b/l and TM's neg, nares patent w/o discharge, pharynx pink and without exudates, MMM, no sinus TTP Neck: No masses or asymmetry Heart: RRR Lungs: CTAB, no accessory muscle use Psych: Age appropriate judgment and insight, normal mood and affect  Sore throat - Plan: POCT rapid strep A  Strep test neg. suspect allergic rhinitis.  Depo injection 80 mg IM today.  If no improvement, she will let me know and we will treat for reflux.  We will change Xyzal to another oral antihistamine, starting with Allegra. F/u prn. If starting to experience fevers, shaking, or shortness of breath, seek immediate care. Pt voiced understanding and agreement to the plan.  [name] [place], DO 10/27/23 12:07 PM

## 2023-10-27 NOTE — Addendum Note (Signed)
Addended by: Marian Sorrow D on: 10/27/2023 12:16 PM   Modules accepted: Orders

## 2023-10-27 NOTE — Patient Instructions (Signed)
Send me a message if no better.   Claritin (loratadine), Allegra (fexofenadine), Zyrtec (cetirizine) which is also equivalent to Xyzal (levocetirizine); these are listed in order from weakest to strongest. Generic, and therefore cheaper, options are in the parentheses.   Flonase (fluticasone); nasal spray that is over the counter. 2 sprays each nostril, once daily. Aim towards the same side eye when you spray.  There are available OTC, and the generic versions, which may be cheaper, are in parentheses. Show this to a pharmacist if you have trouble finding any of these items.  Let us know if you need anything.

## 2024-01-17 ENCOUNTER — Other Ambulatory Visit (HOSPITAL_BASED_OUTPATIENT_CLINIC_OR_DEPARTMENT_OTHER): Payer: Self-pay

## 2024-01-17 ENCOUNTER — Other Ambulatory Visit: Payer: Self-pay | Admitting: Family Medicine

## 2024-01-17 DIAGNOSIS — T7840XA Allergy, unspecified, initial encounter: Secondary | ICD-10-CM

## 2024-01-17 MED ORDER — AZELASTINE HCL 0.1 % NA SOLN
2.0000 | Freq: Two times a day (BID) | NASAL | 12 refills | Status: AC
  Filled 2024-01-17: qty 30, 50d supply, fill #0
  Filled 2024-08-16: qty 30, 50d supply, fill #1

## 2024-01-24 ENCOUNTER — Encounter: Payer: Self-pay | Admitting: Family Medicine

## 2024-01-24 ENCOUNTER — Other Ambulatory Visit (HOSPITAL_BASED_OUTPATIENT_CLINIC_OR_DEPARTMENT_OTHER): Payer: Self-pay

## 2024-01-24 ENCOUNTER — Ambulatory Visit (INDEPENDENT_AMBULATORY_CARE_PROVIDER_SITE_OTHER): Admitting: Family Medicine

## 2024-01-24 VITALS — BP 126/78 | HR 75 | Temp 98.0°F | Resp 16 | Ht 65.0 in | Wt 311.0 lb

## 2024-01-24 DIAGNOSIS — Z0000 Encounter for general adult medical examination without abnormal findings: Secondary | ICD-10-CM

## 2024-01-24 MED ORDER — DICYCLOMINE HCL 10 MG PO CAPS
10.0000 mg | ORAL_CAPSULE | Freq: Four times a day (QID) | ORAL | 0 refills | Status: DC | PRN
  Filled 2024-01-24: qty 21, 6d supply, fill #0

## 2024-01-24 NOTE — Progress Notes (Signed)
 Chief Complaint  Patient presents with   Annual Exam    Annual Exam     Well Woman Destiny Lee is here for a complete physical.   Her last physical was >1 year ago.  Current diet: in general, a "healthy" diet. Current exercise: walking, elliptical. Weight is intentionally losing and she denies fatigue out of ordinary. No LMP recorded. (Menstrual status: IUD).  Seatbelt? Yes Advanced directive? No  Health Maintenance Pap/HPV- Yes Mammogram- Yes Tetanus- Yes Hep C screening- Yes HIV screening- Yes  Past Medical History:  Diagnosis Date   Obesity      Past Surgical History:  Procedure Laterality Date   NO PAST SURGERIES      Medications  Current Outpatient Medications on File Prior to Visit  Medication Sig Dispense Refill   azelastine  (ASTELIN ) 0.1 % nasal spray Place 2 sprays into both nostrils 2 (two) times daily. Use in each nostril as directed 30 mL 12   fexofenadine  (ALLEGRA  ALLERGY) 180 MG tablet Take 1 tablet (180 mg total) by mouth daily. 30 tablet 5   levonorgestrel (MIRENA) 20 MCG/24HR IUD 1 each by Intrauterine route once.     montelukast  (SINGULAIR ) 10 MG tablet Take 1 tablet (10 mg total) by mouth at bedtime.     fluticasone  (FLONASE ) 50 MCG/ACT nasal spray Place 2 sprays into both nostrils daily. 16 g 1    Allergies No Known Allergies  Review of Systems: Constitutional:  no unexpected weight changes Eye:  no recent significant change in vision Ear/Nose/Mouth/Throat:  Ears:  no recent change in hearing Nose/Mouth/Throat:  no complaints of nasal congestion, no sore throat Cardiovascular: no chest pain Respiratory:  no shortness of breath Gastrointestinal: + Watery diarrhea and stomach cramping over the past several days GU:  Female: negative for dysuria or pelvic pain Musculoskeletal/Extremities:  no pain of the joints Integumentary (Skin/Breast): + Skin tag on neck Neurologic:  no headaches Endocrine:  denies fatigue Hematologic/Lymphatic:  No  areas of easy bleeding  Exam BP 126/78 (BP Location: Left Arm, Patient Position: Sitting)   Pulse 75   Temp 98 F (36.7 C) (Oral)   Resp 16   Ht 5\' 5"  (1.651 m)   Wt (!) 311 lb (141.1 kg)   SpO2 98%   BMI 51.75 kg/m  General:  well developed, well nourished, in no apparent distress Skin: Hyperpigmented and pedunculated lesions noted on the left lateral neck without erythema or TTP; otherwise no significant moles, warts, or growths Head:  no masses, lesions, or tenderness Eyes:  pupils equal and round, sclera anicteric without injection Ears:  canals without lesions, TMs shiny without retraction, no obvious effusion, no erythema Nose:  nares patent, mucosa normal, and no drainage Throat/Pharynx:  lips and gingiva without lesion; tongue and uvula midline; non-inflamed pharynx; no exudates or postnasal drainage Neck: neck supple without adenopathy, thyromegaly, or masses Lungs:  clear to auscultation, breath sounds equal bilaterally, no respiratory distress Cardio:  regular rate and rhythm, no LE edema Abdomen:  abdomen soft, mild TTP in the upper quadrants; bowel sounds normal; no masses or organomegaly Genital: Defer to GYN Musculoskeletal:  symmetrical muscle groups noted without atrophy or deformity Extremities:  no clubbing, cyanosis, or edema, no deformities, no skin discoloration Neuro:  gait normal; deep tendon reflexes normal and symmetric Psych: well oriented with normal range of affect and appropriate judgment/insight  Assessment and Plan  Well adult exam - Plan: CBC, Comprehensive metabolic panel with GFR, Lipid panel   Well 43 y.o. female. Counseled  on diet and exercise. Advanced directive form provided today.  Bentyl  as needed for abdominal issues, likely acute infective process.  If not getting better the next couple days, she will send me a message and we will check a stool culture. Removal versus watchful waiting versus referral provided for the skin tags.  She  opted for removal but would like to schedule this at a later date. Other orders as above. Follow up in 1 yr. The patient voiced understanding and agreement to the plan.  Shellie Dials [place], DO 01/24/24 1:37 PM

## 2024-01-24 NOTE — Patient Instructions (Addendum)
 Give us  2-3 business days to get the results of your labs back.   Keep the diet clean and stay active.  Artificial tears like Refresh and Systane may be used for comfort. OK to get generic version. Generally people use them every 2-4 hours, but you can use them as much as you want because there is no medication in it.  Please get me a copy of your advanced directive form at your convenience.   Please consider adding some weight resistance exercise to your routine. Consider yoga as well.   Send me a message if not improving by Friday morning and we will get a stool sample.   Let us  know if you need anything.

## 2024-01-25 ENCOUNTER — Encounter: Payer: Self-pay | Admitting: Family Medicine

## 2024-01-25 LAB — COMPREHENSIVE METABOLIC PANEL WITH GFR
ALT: 11 U/L (ref 0–35)
AST: 10 U/L (ref 0–37)
Albumin: 4 g/dL (ref 3.5–5.2)
Alkaline Phosphatase: 46 U/L (ref 39–117)
BUN: 12 mg/dL (ref 6–23)
CO2: 27 meq/L (ref 19–32)
Calcium: 8.7 mg/dL (ref 8.4–10.5)
Chloride: 103 meq/L (ref 96–112)
Creatinine, Ser: 0.78 mg/dL (ref 0.40–1.20)
GFR: 92.93 mL/min (ref >60.00)
Glucose, Bld: 84 mg/dL (ref 70–99)
Potassium: 3.4 meq/L — ABNORMAL LOW (ref 3.5–5.1)
Sodium: 139 meq/L (ref 135–145)
Total Bilirubin: 0.5 mg/dL (ref 0.2–1.2)
Total Protein: 7.2 g/dL (ref 6.0–8.3)

## 2024-01-25 LAB — LIPID PANEL
Cholesterol: 214 mg/dL — ABNORMAL HIGH (ref 0–200)
HDL: 48.7 mg/dL (ref >39.00)
LDL Cholesterol: 149 mg/dL — ABNORMAL HIGH (ref 0–99)
NonHDL: 165.16
Total CHOL/HDL Ratio: 4
Triglycerides: 79 mg/dL (ref 0.0–149.0)
VLDL: 15.8 mg/dL (ref 0.0–40.0)

## 2024-01-25 LAB — CBC
HCT: 40.2 % (ref 36.0–46.0)
Hemoglobin: 13.3 g/dL (ref 12.0–15.0)
MCHC: 33 g/dL (ref 30.0–36.0)
MCV: 98.2 fl (ref 78.0–100.0)
Platelets: 218 10*3/uL (ref 150.0–400.0)
RBC: 4.09 Mil/uL (ref 3.87–5.11)
RDW: 13.6 % (ref 11.5–15.5)
WBC: 5.5 10*3/uL (ref 4.0–10.5)

## 2024-01-26 ENCOUNTER — Other Ambulatory Visit: Payer: Self-pay

## 2024-01-26 ENCOUNTER — Other Ambulatory Visit: Payer: Self-pay | Admitting: Family Medicine

## 2024-01-26 ENCOUNTER — Other Ambulatory Visit (HOSPITAL_BASED_OUTPATIENT_CLINIC_OR_DEPARTMENT_OTHER): Payer: Self-pay

## 2024-01-26 DIAGNOSIS — E876 Hypokalemia: Secondary | ICD-10-CM

## 2024-01-26 DIAGNOSIS — E7800 Pure hypercholesterolemia, unspecified: Secondary | ICD-10-CM

## 2024-01-26 MED ORDER — ROSUVASTATIN CALCIUM 5 MG PO TABS
5.0000 mg | ORAL_TABLET | Freq: Every day | ORAL | 3 refills | Status: DC
  Filled 2024-01-26: qty 90, 90d supply, fill #0

## 2024-01-31 ENCOUNTER — Other Ambulatory Visit: Payer: Self-pay

## 2024-01-31 ENCOUNTER — Other Ambulatory Visit (INDEPENDENT_AMBULATORY_CARE_PROVIDER_SITE_OTHER)

## 2024-01-31 ENCOUNTER — Telehealth: Payer: Self-pay

## 2024-01-31 DIAGNOSIS — E876 Hypokalemia: Secondary | ICD-10-CM

## 2024-01-31 DIAGNOSIS — E7800 Pure hypercholesterolemia, unspecified: Secondary | ICD-10-CM

## 2024-01-31 LAB — LIPID PANEL
Cholesterol: 214 mg/dL — ABNORMAL HIGH (ref 0–200)
HDL: 44.6 mg/dL (ref >39.00)
LDL Cholesterol: 151 mg/dL — ABNORMAL HIGH (ref 0–99)
NonHDL: 169.49
Total CHOL/HDL Ratio: 5
Triglycerides: 91 mg/dL (ref 0.0–149.0)
VLDL: 18.2 mg/dL (ref 0.0–40.0)

## 2024-01-31 LAB — HEPATIC FUNCTION PANEL
ALT: 11 U/L (ref 0–35)
AST: 10 U/L (ref 0–37)
Albumin: 4 g/dL (ref 3.5–5.2)
Alkaline Phosphatase: 45 U/L (ref 39–117)
Bilirubin, Direct: 0.1 mg/dL (ref 0.0–0.3)
Total Bilirubin: 0.4 mg/dL (ref 0.2–1.2)
Total Protein: 6.9 g/dL (ref 6.0–8.3)

## 2024-01-31 LAB — MAGNESIUM: Magnesium: 2.1 mg/dL (ref 1.5–2.5)

## 2024-02-01 ENCOUNTER — Encounter: Payer: Self-pay | Admitting: Family Medicine

## 2024-02-01 LAB — BASIC METABOLIC PANEL WITH GFR
BUN: 13 mg/dL (ref 7–25)
CO2: 26 mmol/L (ref 20–32)
Calcium: 8.7 mg/dL (ref 8.6–10.2)
Chloride: 106 mmol/L (ref 98–110)
Creat: 0.77 mg/dL (ref 0.50–0.99)
Glucose, Bld: 82 mg/dL (ref 65–99)
Potassium: 3.9 mmol/L (ref 3.5–5.3)
Sodium: 140 mmol/L (ref 135–146)
eGFR: 98 mL/min/{1.73_m2} (ref >=60)

## 2024-02-02 NOTE — Telephone Encounter (Signed)
 done

## 2024-03-06 ENCOUNTER — Other Ambulatory Visit

## 2024-04-03 ENCOUNTER — Other Ambulatory Visit

## 2024-06-19 ENCOUNTER — Ambulatory Visit: Admitting: Obstetrics and Gynecology

## 2024-06-19 VITALS — BP 117/85 | HR 64 | Ht 65.0 in | Wt 300.0 lb

## 2024-06-19 DIAGNOSIS — Z30433 Encounter for removal and reinsertion of intrauterine contraceptive device: Secondary | ICD-10-CM

## 2024-06-19 MED ORDER — LEVONORGESTREL 20 MCG/DAY IU IUD
1.0000 | INTRAUTERINE_SYSTEM | Freq: Once | INTRAUTERINE | Status: AC
  Administered 2024-06-19: 1 via INTRAUTERINE

## 2024-06-19 NOTE — Progress Notes (Signed)
   GYNECOLOGY OFFICE PROCEDURE NOTE  Destiny Lee is a 43 y.o. G2P1011 here for IUD removal and insertion. No GYN concerns.     We reviewed the risks of pain, bleeding, infection, incomplete IUD removal or failed IUD removal.  We reviewed the insertion including risks, benefits and alternatives of procedure were discussed including irregular bleeding, cramping, infection, malpositioning or misplacement of the IUD outside the uterus which may require further procedure such as laparoscopy. Also discussed >99% contraception efficacy, increased risk of ectopic pregnancy with failure of method.   Patient identified, informed consent performed, consent signed.  Time out performed.  IUD Removal  A bimanual exam was performed. Patient was in the dorsal lithotomy position, normal external genitalia was noted.  A speculum was placed in the patient's vagina, normal discharge was noted, no lesions. The cervix was visualized, no lesions, no abnormal discharge.  Betadine applied, hurricane spray applied. Tenaculum placed on the anterior lip of the cervix. The strings of the IUD were NOT visualized. Cytobrush used and still not seen. Kelly forceps were introduced into the cervical canal and the IUD was grasped and removed in its entirety.   Patient tolerated the procedure well.      IUD Insertion Procedure Note Procedure: IUD insertion with Mirena  UPT: Negative  The cervix was already prepped with betadine and hurricane spray.  Uterus sounded to 7 cm  and IUD then inserted without difficulty per manufacturer's instructions and strings cut to 3 cm below cervical os and all instruments removed. Pt tolerated well with minimal pain and bleeding.   Uncomplicated IUD insertion Ibuprofen prn cramping Manufacturer pamphlet/patient information given.  Reviewed timing of efficacy for contraception and to use an alternative form of birth control until that time.  Rollo ONEIDA Bring, MD, FACOG Obstetrician &  Gynecologist, [facility] for [facility], [facility]

## 2024-07-02 ENCOUNTER — Other Ambulatory Visit: Payer: Self-pay | Admitting: Family Medicine

## 2024-07-02 ENCOUNTER — Other Ambulatory Visit (HOSPITAL_BASED_OUTPATIENT_CLINIC_OR_DEPARTMENT_OTHER): Payer: Self-pay

## 2024-07-02 MED ORDER — FAMOTIDINE 20 MG PO TABS
20.0000 mg | ORAL_TABLET | Freq: Two times a day (BID) | ORAL | 0 refills | Status: DC
  Filled 2024-07-02: qty 60, 30d supply, fill #0

## 2024-07-06 ENCOUNTER — Emergency Department (HOSPITAL_BASED_OUTPATIENT_CLINIC_OR_DEPARTMENT_OTHER): Admission: EM | Admit: 2024-07-06 | Discharge: 2024-07-06 | Disposition: A | Discharge status: Home / Self Care

## 2024-07-06 ENCOUNTER — Other Ambulatory Visit: Payer: Self-pay

## 2024-07-06 ENCOUNTER — Encounter (HOSPITAL_BASED_OUTPATIENT_CLINIC_OR_DEPARTMENT_OTHER): Payer: Self-pay | Admitting: Emergency Medicine

## 2024-07-06 DIAGNOSIS — N939 Abnormal uterine and vaginal bleeding, unspecified: Secondary | ICD-10-CM | POA: Diagnosis not present

## 2024-07-06 DIAGNOSIS — M545 Low back pain, unspecified: Secondary | ICD-10-CM | POA: Diagnosis not present

## 2024-07-06 LAB — URINALYSIS, MICROSCOPIC (REFLEX): RBC / HPF: >50 RBC/hpf (ref 0–5)

## 2024-07-06 LAB — URINALYSIS, ROUTINE W REFLEX MICROSCOPIC
Bilirubin Urine: NEGATIVE
Glucose, UA: NEGATIVE mg/dL
Ketones, ur: NEGATIVE mg/dL
Leukocytes,Ua: NEGATIVE
Nitrite: NEGATIVE
Protein, ur: NEGATIVE mg/dL
Specific Gravity, Urine: 1.015 (ref 1.005–1.030)
pH: 7.5 (ref 5.0–8.0)

## 2024-07-06 LAB — PREGNANCY, URINE: Preg Test, Ur: NEGATIVE

## 2024-07-06 NOTE — ED Provider Notes (Signed)
 [facility] Provider Note   CSN: [id number] Arrival date & time: 07/06/24  1240     Patient presents with: Back Pain and Vaginal Bleeding   Destiny Lee is a 43 y.o. female with recent Mirena  IUD insertion on 06/15/2024 with Dr. Rollo Bring, presents with concern for vaginal bleeding that started yesterday.  She reports that she also has some lower back pain and is concerned about the position of the IUD as she had some back pain when it was initially inserted.  She denies any dysuria or increased frequency.  Denies any flank pain, fevers, or chills. Denies any abdominal pain.     Back Pain Vaginal Bleeding Associated symptoms: back pain        Prior to Admission medications   Medication Sig Start Date End Date Taking? Authorizing Provider  azelastine  (ASTELIN ) 0.1 % nasal spray Place 2 sprays into both nostrils 2 (two) times daily. Use in each nostril as directed 01/17/24   Frann Mabel Mt, DO  dicyclomine  (BENTYL ) 10 MG capsule Take 1 capsule (10 mg total) by mouth every 6 (six) hours as needed for abdominal cramping. Patient not taking: Reported on 06/19/2024 01/24/24   Frann Mabel Mt, DO  famotidine (PEPCID) 20 MG tablet Take 1 tablet (20 mg total) by mouth 2 (two) times daily. 07/02/24   Frann Mabel Mt, DO  fexofenadine  (ALLEGRA  ALLERGY) 180 MG tablet Take 1 tablet (180 mg total) by mouth daily. 10/27/23   Frann Mabel Mt, DO  fluticasone  (FLONASE ) 50 MCG/ACT nasal spray Place 2 sprays into both nostrils daily. Patient not taking: Reported on 06/19/2024 11/18/22 08/24/23  Frann Mabel Mt, DO  montelukast  (SINGULAIR ) 10 MG tablet Take 1 tablet (10 mg total) by mouth at bedtime. 08/01/23 07/31/24  Frann Mabel Mt, DO  rosuvastatin  (CRESTOR ) 5 MG tablet Take 1 tablet (5 mg total) by mouth daily. Patient not taking: Reported on 06/19/2024 01/26/24   Frann Mabel Mt, DO    Allergies:  Patient has no known allergies.    Review of Systems  Genitourinary:  Positive for vaginal bleeding.  Musculoskeletal:  Positive for back pain.    Updated Vital Signs BP (!) 148/106 (BP Location: Right Arm)   Pulse 71   Temp 97.8 F (36.6 C)   Resp 18   Ht 5' 5 (1.651 m)   Wt 136 kg   SpO2 96%   BMI 49.89 kg/m   Physical Exam Vitals and nursing note reviewed. Exam conducted with a chaperone present.  Constitutional:      Appearance: Normal appearance.  HENT:     Head: Atraumatic.  Cardiovascular:     Rate and Rhythm: Normal rate and regular rhythm.  Pulmonary:     Effort: Pulmonary effort is normal.  Abdominal:     Comments: Abdomen is soft and nontender to palpation No CVA tenderness bilaterally  Genitourinary:    Comments: Pelvic exam performed with RN chaperone present  Vaginal canal with mild amount of blood.  Unable to visualize the cervical os on exam.  Musculoskeletal:     Comments: Nontender over the musculature of the lower back bilaterally  Neurological:     General: No focal deficit present.     Mental Status: She is alert.  Psychiatric:        Mood and Affect: Mood normal.        Behavior: Behavior normal.     (all labs ordered are listed, but only abnormal results are displayed) Labs  Reviewed  URINALYSIS, ROUTINE W REFLEX MICROSCOPIC - Abnormal; Notable for the following components:      Result Value   Color, Urine STRAW (*)    APPearance HAZY (*)    Hgb urine dipstick LARGE (*)    All other components within normal limits  URINALYSIS, MICROSCOPIC (REFLEX) - Abnormal; Notable for the following components:   Bacteria, UA MANY (*)    All other components within normal limits  PREGNANCY, URINE    EKG: None  Radiology: No results found.   Procedures   Medications Ordered in the ED - No data to display                                  Medical Decision Making Amount and/or Complexity of Data Reviewed Labs:  ordered.     Differential diagnosis includes but is not limited to abnormal uterine bleeding, pregnancy, IUD out of place, acute cystitis, pyelonephritis, musculoskeletal lower back pain  ED Course:  Upon initial evaluation, patient is very well-appearing, no acute distress.  Stable vitals aside from her elevated blood pressure 148/106 upon arrival.  Her abdomen is soft and nontender, not complaining of any abdominal pain, no fever or tachycardia, do not feel she needs any lab work at this time.  Her main concern is if her IUD is correctly positioned.  On pelvic exam with RN chaperone present, I was unable to visualize the cervical os and therefore the IUD strings.  There was some mild amount of blood in the vaginal vault.  I offered patient an ultrasound here to evaluate for IUD placement.  We discussed that ultimately, she will need to follow-up with her OBGYN even if it is out of place.  She declines ultrasound at this time.  Her urinalysis was negative for nitrates and leukocytes.  Bacteria were noted, but patient without any dysuria, increased frequency, or suprapubic pain.  No flank pain.  Do not feel patient has UTI or pyelonephritis given no urinary symptoms.  No severe or persistent vaginal bleeding, no feelings of dizziness or weakness, no concern for acute blood loss anemia at this time.  Stable and appropriate for discharge home.  Labs Ordered: I Ordered, and personally interpreted labs.  The pertinent results include:   Urinalysis negative for nitrates and leukocytes, bacteria noted on microscopic reflex Pregnancy negative   Impression: Vaginal bleeding  Disposition:  The patient was discharged home with instructions to follow-up with her OB/GYN versus possible for further evaluation of IUD placement. Return precautions given.    Record Review: External records from outside source obtained and reviewed including IUD insertion on 06/19/2024 with Dr. Rollo Bring     This  chart was dictated using voice recognition software, Dragon. Despite the best efforts of this provider to proofread and correct errors, errors may still occur which can change documentation meaning.       Final diagnoses:  Vaginal bleeding    ED Discharge Orders     None          Veta Palma, [place] 07/06/24 1701    Rogelia Jerilynn RAMAN, MD 07/07/24 1421

## 2024-07-06 NOTE — Discharge Instructions (Addendum)
 I was unable to visualize your IUD strings on exam today due to not being able to get your cervix in the right position on your exam.  Your IUD could still be in correct positioning.   The bleeding you are experiencing may be due to the IUD you have in place as it is common to have some vaginal spotting after IUD placement.  Please follow-up with your OB/GYN as soon as possible for further evaluation of your symptoms.  They will be able to repeat a pelvic exam and perform an ultrasound if needed to determine placement of your IUD.  Please return to the ER if you have increased vaginal bleeding, feeling lightheaded or dizzy, worsening abdominal pain or back pain, any other new or concerning symptoms

## 2024-07-06 NOTE — ED Triage Notes (Signed)
 Pt had IUD replaced 2 wks ago; has been spotting since and then started passing clots yesterday; now she is having lower back pain and is concerned about the position of the IUD

## 2024-07-07 ENCOUNTER — Encounter: Payer: Self-pay | Admitting: Obstetrics and Gynecology

## 2024-07-09 ENCOUNTER — Other Ambulatory Visit (HOSPITAL_BASED_OUTPATIENT_CLINIC_OR_DEPARTMENT_OTHER): Payer: Self-pay

## 2024-07-09 ENCOUNTER — Telehealth (INDEPENDENT_AMBULATORY_CARE_PROVIDER_SITE_OTHER)

## 2024-07-09 VITALS — Ht 65.0 in | Wt 300.0 lb

## 2024-07-09 DIAGNOSIS — Z975 Presence of (intrauterine) contraceptive device: Secondary | ICD-10-CM | POA: Diagnosis not present

## 2024-07-09 DIAGNOSIS — N921 Excessive and frequent menstruation with irregular cycle: Secondary | ICD-10-CM | POA: Diagnosis not present

## 2024-07-09 MED ORDER — IBUPROFEN 800 MG PO TABS
800.0000 mg | ORAL_TABLET | Freq: Three times a day (TID) | ORAL | 1 refills | Status: AC | PRN
Start: 2024-07-09 — End: ?
  Filled 2024-07-09: qty 45, 15d supply, fill #0

## 2024-07-09 NOTE — Progress Notes (Signed)
 GYNECOLOGY VIRTUAL VISIT ENCOUNTER NOTE  Provider location: Center for [facility]   Patient location: Home  I connected with Destiny Lee on 07/09/24 at  2:10 PM EDT by MyChart Video Encounter and verified that I am speaking with the correct person using two identifiers.   I discussed the limitations, risks, security and privacy concerns of performing an evaluation and management service virtually and the availability of in person appointments. I also discussed with the patient that there may be a patient responsible charge related to this service. The patient expressed understanding and agreed to proceed.   History:  Destiny Lee is a 43 y.o. G41P1011 female being evaluated today for IUD problems. She states she had some spotting Oct 2nd overnight that occurred again on Oct 10th.  She states within the hour she was having heavy bleeding with clots. She states she had to use regular and super tampons.  She states the clots were as big as a penny or nickel. She states she was having pain and pressure on Saturday Oct 11th and was told no strings were visualized during her ED visit. She is concerned that her IUD is malpositioned and is causing this bleeding.   Patient had IUD placed on Sept 24th in office. She denies bleeding or cramping with her previous IUD.   She states she continues to have bleeding. She reports taking ibuprofen 3 (200mg ) tablets.        Past Medical History:  Diagnosis Date   Obesity    Past Surgical History:  Procedure Laterality Date   NO PAST SURGERIES     The following portions of the patient's history were reviewed and updated as appropriate: allergies, current medications, past family history, past medical history, past social history, past surgical history and problem list.   Health Maintenance:  Normal pap and negative HRHPV on Nov 2022.  Normal mammogram on Oct 2024.   Review of Systems:  Pertinent items noted in  HPI and remainder of comprehensive ROS otherwise negative.  Physical Exam:   General:  Alert, oriented and cooperative. Patient appears to be in no acute distress.  Mental Status: Normal mood and affect. Normal behavior. Normal judgment and thought content.   Respiratory: Normal respiratory effort, no problems with respiration noted  Rest of physical exam deferred due to type of encounter  Labs and Imaging Results for orders placed or performed during the hospital encounter of 07/06/24 (from the past 2 weeks)  Urinalysis, Routine w reflex microscopic -Urine, Clean Catch   Collection Time: 07/06/24 12:52 PM  Result Value Ref Range   Color, Urine STRAW (A) YELLOW   APPearance HAZY (A) CLEAR   Specific Gravity, Urine 1.015 1.005 - 1.030   pH 7.5 5.0 - 8.0   Glucose, UA NEGATIVE NEGATIVE mg/dL   Hgb urine dipstick LARGE (A) NEGATIVE   Bilirubin Urine NEGATIVE NEGATIVE   Ketones, ur NEGATIVE NEGATIVE mg/dL   Protein, ur NEGATIVE NEGATIVE mg/dL   Nitrite NEGATIVE NEGATIVE   Leukocytes,Ua NEGATIVE NEGATIVE  Urinalysis, Microscopic (reflex)   Collection Time: 07/06/24 12:52 PM  Result Value Ref Range   RBC / HPF >50 0 - 5 RBC/hpf   WBC, UA 0-5 0 - 5 WBC/hpf   Bacteria, UA MANY (A) NONE SEEN   Squamous Epithelial / HPF 0-5 0 - 5 /HPF   Mucus PRESENT    Hyaline Casts, UA PRESENT   Pregnancy, urine   Collection Time: 07/06/24 12:58 PM  Result Value Ref  Range   Preg Test, Ur NEGATIVE NEGATIVE   No results found.     Assessment and Plan:     43 year old female BTB on IUD  -Informed that some bleeding anticipated after IUD insertion. -However, considering pain and menstrual flow would be agreeable to sending for US  to confirm presentation. -Discussed that US  will definitively confirm presence, but does not always note location.  -Order for pelvic US  with transvaginal placed.  -If location is not optimal will plan to come to office for removal and reinsertion.  -Reviewed usage of  ibuprofen for pain management. Rx for ibuprofen 800mg  sent to pharmacy on file.  -Plan to follow up, via mychart, after results for next steps. [facility] UA reviewed and many bacteria noted. May consider repeat if symptoms arise.     I discussed the assessment and treatment plan with the patient. The patient was provided an opportunity to ask questions and all were answered. The patient agreed with the plan and demonstrated an understanding of the instructions.   The patient was advised to call back or seek an in-person evaluation/go to the ED if the symptoms worsen or if the condition fails to improve as anticipated.  I provided 12 minutes of face-to-face time during this encounter. I also spent 6 minutes dedicated to the care of this patient including pre-visit review of records, post visit ordering of medications and appropriate tests or procedures, coordinating care and documenting this visit encounter.    Harlene LITTIE Duncans, CNM Center for [employer], [facility]

## 2024-07-10 ENCOUNTER — Ambulatory Visit (INDEPENDENT_AMBULATORY_CARE_PROVIDER_SITE_OTHER)

## 2024-07-10 DIAGNOSIS — Z975 Presence of (intrauterine) contraceptive device: Secondary | ICD-10-CM | POA: Diagnosis not present

## 2024-07-10 DIAGNOSIS — N921 Excessive and frequent menstruation with irregular cycle: Secondary | ICD-10-CM | POA: Diagnosis not present

## 2024-07-10 DIAGNOSIS — N83292 Other ovarian cyst, left side: Secondary | ICD-10-CM | POA: Diagnosis not present

## 2024-07-17 ENCOUNTER — Ambulatory Visit: Payer: Self-pay

## 2024-07-17 NOTE — Telephone Encounter (Signed)
 Patient made aware of US  results and recommendation from [facility].  Patient agreed to see MD for further management.

## 2024-07-17 NOTE — Telephone Encounter (Signed)
-----   Message from Harlene LITTIE Duncans sent at 07/17/2024  8:36 AM EDT ----- Please call patient and inform that IUD does not appear to be properly placed. Due to inability to visualize strings she should be scheduled with MD for removal and reinsertion if desired. Thanks  ----- Message ----- From: Interface, Rad Results In Sent: 07/14/2024   7:53 PM EDT To: Harlene Duncans, CNM

## 2024-07-22 ENCOUNTER — Other Ambulatory Visit (HOSPITAL_BASED_OUTPATIENT_CLINIC_OR_DEPARTMENT_OTHER): Payer: Self-pay

## 2024-07-22 MED ORDER — FLUZONE 0.5 ML IM SUSY
0.5000 mL | PREFILLED_SYRINGE | Freq: Once | INTRAMUSCULAR | 0 refills | Status: AC
  Filled 2024-07-22: qty 0.5, 1d supply, fill #0

## 2024-07-23 ENCOUNTER — Telehealth: Payer: Self-pay

## 2024-07-23 NOTE — Telephone Encounter (Signed)
 Patient called in stating that she was in a lot of pain with her IUD and she did not think she could wait until 11/6 to get her IUD removed. She requested an earlier appointment in the [facility] office but we do not have a provider here today.  Worked with our [facility] team and got the patient scheduled on 10/31 with Dr. Zina.

## 2024-07-26 ENCOUNTER — Encounter: Payer: Self-pay | Admitting: Obstetrics and Gynecology

## 2024-07-26 ENCOUNTER — Other Ambulatory Visit (HOSPITAL_COMMUNITY)
Admission: RE | Admit: 2024-07-26 | Discharge: 2024-07-26 | Disposition: A | Source: Ambulatory Visit | Attending: Obstetrics and Gynecology | Admitting: Obstetrics and Gynecology

## 2024-07-26 ENCOUNTER — Ambulatory Visit (INDEPENDENT_AMBULATORY_CARE_PROVIDER_SITE_OTHER): Admitting: Obstetrics and Gynecology

## 2024-07-26 VITALS — BP 126/86 | HR 67 | Ht 65.0 in | Wt 304.0 lb

## 2024-07-26 DIAGNOSIS — Z30433 Encounter for removal and reinsertion of intrauterine contraceptive device: Secondary | ICD-10-CM

## 2024-07-26 DIAGNOSIS — Z01419 Encounter for gynecological examination (general) (routine) without abnormal findings: Secondary | ICD-10-CM

## 2024-07-26 DIAGNOSIS — Z113 Encounter for screening for infections with a predominantly sexual mode of transmission: Secondary | ICD-10-CM

## 2024-07-26 DIAGNOSIS — Z6841 Body Mass Index (BMI) 40.0 and over, adult: Secondary | ICD-10-CM | POA: Insufficient documentation

## 2024-07-26 DIAGNOSIS — Z1231 Encounter for screening mammogram for malignant neoplasm of breast: Secondary | ICD-10-CM

## 2024-07-26 MED ORDER — LEVONORGESTREL 20 MCG/DAY IU IUD
1.0000 | INTRAUTERINE_SYSTEM | Freq: Once | INTRAUTERINE | Status: AC
  Administered 2024-07-26: 1 via INTRAUTERINE

## 2024-07-26 NOTE — Progress Notes (Signed)
    GYNECOLOGY OFFICE PROCEDURE NOTE  Destiny Lee is a 43 y.o. G2P1011 here for  IUD removal. No GYN concerns.   IUD Removal  Patient identified, informed consent performed, consent signed.  Patient was in the dorsal lithotomy position, normal external genitalia was noted.  A speculum was placed in the patient's vagina, normal discharge was noted, no lesions. The cervix was visualized, no lesions, no abnormal discharge.  The strings of the IUD were grasped and pulled using ring forceps. The IUD was removed in its entirety.   Patient tolerated the procedure well.      GYNECOLOGY OFFICE PROCEDURE NOTE  Destiny Lee is a 43 y.o. G2P1011 here for mirena  IUD  re-insertion. No GYN concerns.  IUD removed just prior due to malposition  IUD Insertion Procedure Note Patient identified, informed consent performed, consent signed.   Discussed risks of irregular bleeding, cramping, infection, malpositioning or misplacement of the IUD outside the uterus which may require further procedure such as laparoscopy. Also discussed >99% contraception efficacy, increased risk of ectopic pregnancy with failure of method.  Time out was performed.  Urine pregnancy test negative.  Speculum placed in the vagina.  Cervix visualized.  Cleaned with Betadine x 2.  Grasped anteriorly with a single tooth tenaculum.  Uterus sounded to 8 cm.  Mirena  IUD placed per manufacturer's recommendations.  Strings trimmed to 3 cm. Tenaculum was removed, good hemostasis noted.  Patient tolerated procedure well.   Patient was given post-procedure instructions.  She was advised to have backup contraception for one week.  Patient was also asked to check IUD strings periodically and follow up in 4 weeks for IUD check.     Jerilynn Buddle, MD, FACOG Obstetrician & Gynecologist, [facility] for [facility], [facility]

## 2024-07-26 NOTE — Progress Notes (Signed)
 GYNECOLOGY ANNUAL PREVENTATIVE CARE ENCOUNTER NOTE  History:     Destiny Lee is a 43 y.o. G32P1011 female here for a routine annual gynecologic exam.  Current complaints: cramping from malpositioned IUD.   Denies abnormal vaginal bleeding, discharge, pelvic pain, problems with intercourse or other gynecologic concerns.    Gynecologic History Patient's last menstrual period was 07/05/2024 (exact date). Contraception: IUD Last Pap: 11/22. Results were: normal with negative HPV Last mammogram: 10/24. Results were: normal  Obstetric History OB History  Gravida Para Term Preterm AB Living  2 1 1  1 1   SAB IAB Ectopic Multiple Live Births   1   1    # Outcome Date GA Lbr Len/2nd Weight Sex Type Anes PTL Lv  2 IAB 1999          1 Term 1999 [redacted]w[redacted]d   F Vag-Spont None N LIV    Past Medical History:  Diagnosis Date   Anxiety    Depression    Obesity    Vaginal Pap smear, abnormal     Past Surgical History:  Procedure Laterality Date   NO PAST SURGERIES      Current Outpatient Medications on File Prior to Visit  Medication Sig Dispense Refill   azelastine  (ASTELIN ) 0.1 % nasal spray Place 2 sprays into both nostrils 2 (two) times daily. Use in each nostril as directed 30 mL 12   famotidine (PEPCID) 20 MG tablet Take 1 tablet (20 mg total) by mouth 2 (two) times daily. 60 tablet 0   fexofenadine  (ALLEGRA  ALLERGY) 180 MG tablet Take 1 tablet (180 mg total) by mouth daily. 30 tablet 5   ibuprofen (ADVIL) 800 MG tablet Take 1 tablet (800 mg total) by mouth every 8 (eight) hours as needed for moderate pain (pain score 4-6) or cramping. 45 tablet 1   montelukast  (SINGULAIR ) 10 MG tablet Take 1 tablet (10 mg total) by mouth at bedtime.     No current facility-administered medications on file prior to visit.    No Known Allergies  Social History:  reports that she has never smoked. She has never used smokeless tobacco. She reports current alcohol use. She reports that she does not  use drugs.  Family History  Problem Relation Age of Onset   Breast cancer Maternal Aunt    Asthma Other    Colon cancer Neg Hx    CAD Neg Hx    Diabetes Neg Hx     The following portions of the patient's history were reviewed and updated as appropriate: allergies, current medications, past family history, past medical history, past social history, past surgical history and problem list.  Review of Systems Pertinent items noted in HPI and remainder of comprehensive ROS otherwise negative.  Physical Exam:  BP 126/86   Pulse 67   Ht 5' 5 (1.651 m)   Wt (!) 304 lb (137.9 kg)   LMP 07/05/2024 (Exact Date)   BMI 50.59 kg/m  CONSTITUTIONAL: Well-developed, obese, well-nourished female in no acute distress.  HENT:  Normocephalic, atraumatic, External right and left ear normal. Oropharynx is clear and moist EYES: Conjunctivae and EOM are normal.  NECK: Normal range of motion, supple, no masses.  Normal thyroid .  SKIN: Skin is warm and dry. No rash noted. Not diaphoretic. No erythema. No pallor. MUSCULOSKELETAL: Normal range of motion. No tenderness.  No cyanosis, clubbing, or edema.  2+ distal pulses. NEUROLOGIC: Alert and oriented to person, place, and time. Normal reflexes, muscle tone coordination.  PSYCHIATRIC:  Normal mood and affect. Normal behavior. Normal judgment and thought content. CARDIOVASCULAR: Normal heart rate noted, regular rhythm RESPIRATORY: Clear to auscultation bilaterally. Effort and breath sounds normal, no problems with respiration noted. BREASTS: Symmetric in size. No masses, tenderness, skin changes, nipple drainage, or lymphadenopathy bilaterally. Piercing noted in left nipple.  Performed in the presence of a chaperone. ABDOMEN: Soft, no distention noted.  No tenderness, rebound or guarding.  PELVIC: Normal appearing external genitalia and urethral meatus; normal appearing vaginal mucosa and cervix.  No abnormal discharge noted.  Pap smear obtained.  Vaginal swab  obtained.  Normal uterine size, no other palpable masses, no uterine or adnexal tenderness.  See IUD removal/replacement in separate note. Performed in the presence of a chaperone.   Assessment and Plan:    1. Women's annual routine gynecological examination (Primary) Normal annual exam - Cytology - PAP( [facility]) - MM 3D SCREENING MAMMOGRAM BILATERAL BREAST; Future  2. Routine screening for STI (sexually transmitted infection) Per pt request - Cervicovaginal ancillary only( [place])  3. BMI 50.0-59.9, adult (HCC)   4. Encounter for removal and reinsertion of IUD See separate note - levonorgestrel  (MIRENA ) 20 MCG/DAY IUD 1 each  5. Screening mammogram for breast cancer  - MM 3D SCREENING MAMMOGRAM BILATERAL BREAST; Future  Will follow up results of pap smear and manage accordingly. Mammogram scheduled Routine preventative health maintenance measures emphasized. Please refer to After Visit Summary for other counseling recommendations.      Jerilynn Buddle, MD, FACOG Obstetrician & Gynecologist, [facility] for [facility], [facility]

## 2024-07-26 NOTE — Progress Notes (Signed)
 43 y.o. New GYN presents for AEX/PAP/STD screening.  C/o abdominal pain 6/10 x 3 weeks.  US  shows misplaced IUD, Pt wants to have it removed and replaced.

## 2024-07-29 LAB — CERVICOVAGINAL ANCILLARY ONLY
Bacterial Vaginitis (gardnerella): POSITIVE — AB
Candida Glabrata: NEGATIVE
Candida Vaginitis: NEGATIVE
Chlamydia: NEGATIVE
Comment: NEGATIVE
Comment: NEGATIVE
Comment: NEGATIVE
Comment: NEGATIVE
Comment: NEGATIVE
Comment: NORMAL
Neisseria Gonorrhea: NEGATIVE
Trichomonas: POSITIVE — AB

## 2024-07-30 LAB — CYTOLOGY - PAP
Comment: NEGATIVE
Diagnosis: NEGATIVE
High risk HPV: NEGATIVE

## 2024-07-31 ENCOUNTER — Other Ambulatory Visit: Payer: Self-pay

## 2024-07-31 ENCOUNTER — Ambulatory Visit: Payer: Self-pay | Admitting: Obstetrics and Gynecology

## 2024-07-31 ENCOUNTER — Encounter: Payer: Self-pay | Admitting: Obstetrics and Gynecology

## 2024-07-31 MED ORDER — METRONIDAZOLE 500 MG PO TABS: 500.0000 mg | ORAL_TABLET | Freq: Two times a day (BID) | ORAL | 0 refills | Status: DC

## 2024-08-01 ENCOUNTER — Ambulatory Visit: Admitting: Family Medicine

## 2024-08-01 ENCOUNTER — Inpatient Hospital Stay (HOSPITAL_BASED_OUTPATIENT_CLINIC_OR_DEPARTMENT_OTHER): Admission: RE | Admit: 2024-08-01 | Source: Ambulatory Visit

## 2024-08-06 ENCOUNTER — Ambulatory Visit (HOSPITAL_BASED_OUTPATIENT_CLINIC_OR_DEPARTMENT_OTHER)
Admission: RE | Admit: 2024-08-06 | Discharge: 2024-08-06 | Disposition: A | Discharge status: Home / Self Care | Source: Ambulatory Visit | Attending: Obstetrics and Gynecology | Admitting: Obstetrics and Gynecology

## 2024-08-06 ENCOUNTER — Encounter (HOSPITAL_BASED_OUTPATIENT_CLINIC_OR_DEPARTMENT_OTHER): Payer: Self-pay

## 2024-08-06 DIAGNOSIS — Z1231 Encounter for screening mammogram for malignant neoplasm of breast: Secondary | ICD-10-CM | POA: Insufficient documentation

## 2024-08-06 DIAGNOSIS — Z01419 Encounter for gynecological examination (general) (routine) without abnormal findings: Secondary | ICD-10-CM

## 2024-08-16 ENCOUNTER — Other Ambulatory Visit (HOSPITAL_BASED_OUTPATIENT_CLINIC_OR_DEPARTMENT_OTHER): Payer: Self-pay

## 2024-08-16 ENCOUNTER — Other Ambulatory Visit: Payer: Self-pay

## 2024-08-16 ENCOUNTER — Other Ambulatory Visit: Payer: Self-pay | Admitting: Family Medicine

## 2024-08-16 MED ORDER — FAMOTIDINE 20 MG PO TABS
20.0000 mg | ORAL_TABLET | Freq: Two times a day (BID) | ORAL | 0 refills | Status: DC
  Filled 2024-08-16: qty 60, 30d supply, fill #0

## 2024-08-26 ENCOUNTER — Other Ambulatory Visit (HOSPITAL_BASED_OUTPATIENT_CLINIC_OR_DEPARTMENT_OTHER): Payer: Self-pay

## 2024-09-09 ENCOUNTER — Ambulatory Visit: Admitting: Obstetrics and Gynecology

## 2024-11-01 ENCOUNTER — Encounter: Payer: Self-pay | Admitting: Family Medicine

## 2024-11-01 ENCOUNTER — Ambulatory Visit: Payer: Self-pay | Admitting: Family Medicine

## 2024-11-01 ENCOUNTER — Ambulatory Visit: Admitting: Family Medicine

## 2024-11-01 ENCOUNTER — Other Ambulatory Visit (HOSPITAL_COMMUNITY): Payer: Self-pay

## 2024-11-01 ENCOUNTER — Telehealth: Payer: Self-pay

## 2024-11-01 ENCOUNTER — Other Ambulatory Visit (HOSPITAL_BASED_OUTPATIENT_CLINIC_OR_DEPARTMENT_OTHER): Payer: Self-pay

## 2024-11-01 ENCOUNTER — Other Ambulatory Visit: Payer: Self-pay

## 2024-11-01 VITALS — BP 130/84 | HR 87 | Temp 98.0°F | Resp 16 | Ht 65.0 in | Wt 308.2 lb

## 2024-11-01 DIAGNOSIS — N898 Other specified noninflammatory disorders of vagina: Secondary | ICD-10-CM

## 2024-11-01 DIAGNOSIS — F411 Generalized anxiety disorder: Secondary | ICD-10-CM

## 2024-11-01 DIAGNOSIS — E7800 Pure hypercholesterolemia, unspecified: Secondary | ICD-10-CM | POA: Insufficient documentation

## 2024-11-01 DIAGNOSIS — E559 Vitamin D deficiency, unspecified: Secondary | ICD-10-CM

## 2024-11-01 LAB — LIPID PANEL
Cholesterol: 194 mg/dL (ref 28–200)
HDL: 44.5 mg/dL
LDL Cholesterol: 135 mg/dL — ABNORMAL HIGH (ref 10–99)
NonHDL: 149.35
Total CHOL/HDL Ratio: 4
Triglycerides: 72 mg/dL (ref 10.0–149.0)
VLDL: 14.4 mg/dL (ref 0.0–40.0)

## 2024-11-01 LAB — COMPREHENSIVE METABOLIC PANEL WITH GFR
ALT: 13 U/L (ref 3–35)
AST: 12 U/L (ref 5–37)
Albumin: 4.1 g/dL (ref 3.5–5.2)
Alkaline Phosphatase: 44 U/L (ref 39–117)
BUN: 11 mg/dL (ref 6–23)
CO2: 28 meq/L (ref 19–32)
Calcium: 8.8 mg/dL (ref 8.4–10.5)
Chloride: 105 meq/L (ref 96–112)
Creatinine, Ser: 0.76 mg/dL (ref 0.40–1.20)
GFR: 95.36 mL/min
Glucose, Bld: 81 mg/dL (ref 70–99)
Potassium: 4 meq/L (ref 3.5–5.1)
Sodium: 140 meq/L (ref 135–145)
Total Bilirubin: 0.5 mg/dL (ref 0.2–1.2)
Total Protein: 7.2 g/dL (ref 6.0–8.3)

## 2024-11-01 LAB — TSH: TSH: 4.29 u[IU]/mL (ref 0.35–5.50)

## 2024-11-01 LAB — VITAMIN D 25 HYDROXY (VIT D DEFICIENCY, FRACTURES): VITD: <7 ng/mL — ABNORMAL LOW (ref 30.00–100.00)

## 2024-11-01 MED ORDER — WEGOVY 4 MG PO TABS
4.0000 mg | ORAL_TABLET | Freq: Every day | ORAL | 0 refills | Status: AC
  Filled 2024-11-01: qty 30, 30d supply, fill #0

## 2024-11-01 MED ORDER — WEGOVY 1.5 MG PO TABS
1.5000 mg | ORAL_TABLET | Freq: Every day | ORAL | 0 refills | Status: AC
  Filled 2024-11-01: qty 30, 30d supply, fill #0

## 2024-11-01 MED ORDER — WEGOVY 25 MG PO TABS
25.0000 mg | ORAL_TABLET | Freq: Every day | ORAL | 2 refills | Status: AC
  Filled 2024-11-01: qty 30, 30d supply, fill #0

## 2024-11-01 MED ORDER — VITAMIN D (ERGOCALCIFEROL) 1.25 MG (50000 UNIT) PO CAPS
50000.0000 [IU] | ORAL_CAPSULE | ORAL | 0 refills | Status: AC
  Filled 2024-11-01: qty 12, 84d supply, fill #0

## 2024-11-01 MED ORDER — FAMOTIDINE 20 MG PO TABS
20.0000 mg | ORAL_TABLET | Freq: Two times a day (BID) | ORAL | 1 refills | Status: AC
  Filled 2024-11-01: qty 180, 90d supply, fill #0

## 2024-11-01 MED ORDER — WEGOVY 9 MG PO TABS
9.0000 mg | ORAL_TABLET | Freq: Every day | ORAL | 0 refills | Status: AC
  Filled 2024-11-01: qty 30, 30d supply, fill #0

## 2024-11-01 MED ORDER — FLUCONAZOLE 150 MG PO TABS
ORAL_TABLET | ORAL | 0 refills | Status: AC
  Filled 2024-11-01: qty 2, 4d supply, fill #0

## 2024-11-01 NOTE — Patient Instructions (Signed)
 Give us  2-3 business days to get the results of your labs back.   Keep the diet clean and stay active.  Coping skills Choose 5 that work for you: Take a deep breath Count to 20 Read a book Do a puzzle Meditate Bake Sing Knit Garden Pray Go outside Call a friend Listen to music Take a walk Color Send a note Take a bath Watch a movie Be alone in a quiet place Pet an animal Visit a friend Journal Exercise Stretch   Please consider counseling. Contact [phone] to schedule an appointment or inquire about cost/insurance coverage.  [facility] located at [address], [place], [place].  Phone number = [phone].  Dr. Verdel Silk - Adult Psychiatry.    [facility] located at [address] [place], [place], [place]. Phone number = [phone].   [facility] located at [address] [place], [place].  Phone number = [phone].   [facility] located at [address] [place], [place], [place].  Phone number = [phone].  Let us  know if you need anything.

## 2024-11-01 NOTE — Telephone Encounter (Signed)
 Pharmacy Patient Advocate Encounter   Received notification from Physician's Office that prior authorization for Wegovy  Tablet is required/requested.   Insurance verification completed.   The patient is insured through [employer].   Per test claim: Per test claim, medication is not covered due to plan/benefit exclusion, PA not submitted at this time

## 2024-11-01 NOTE — Progress Notes (Signed)
 Chief Complaint  Patient presents with   Follow-up    Follow Up    Subjective: Patient is a 44 y.o. female here for f/u.  Patient has a history of high cholesterol.  She was started on a statin but never took it.  She is fasting today and would like her cholesterol rechecked today.  Diet could be better.  She is not exercising.  Patient has been having high levels of anxiety.  She is currently living with her mother and trying to buy a house.  She has not had a car payment in 5 years but now also has to buy a car.  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  Patient has racing thoughts, poor sleep, fatigue, and intermittent shortness of breath/chest tightness.  It is not associated with exertion.  No homicidal or suicidal ideation.  No self-medication.  She is not seeing a therapist.  She is not on any medicine currently.  A few months ago the patient was evaluated by the gynecology team and placed on Flagyl  for BV.  She took 2 days and had to stop due to abdominal upset.  She believes she developed a yeast infection from it.  She has been using over-the-counter treatment without relief.  No fevers.  She has not been sexually active since her last STI screening.  Patient has been struggling with her weight.  Around 6 months ago, she made some lifestyle changes and lost around 15 pounds.  Unfortunately she relapsed and gained it all back.  She is interested in starting a medication to help her lose weight.  She is not diabetic.  She has never been through a supervised weight loss program.  Past Medical History:  Diagnosis Date   Anxiety    Depression    Obesity    Vaginal Pap smear, abnormal     Objective: BP 130/84 (BP Location: Left Arm, Patient Position: Sitting)   Pulse 87   Temp 98 F (36.7 C) (Oral)   Resp 16   Ht 5' 5 (1.651 m)   Wt (!) 308 lb 3.2 oz (139.8 kg)   SpO2 97%   BMI 51.29 kg/m  General: Awake, appears stated age Heart: RRR, no LE edema Lungs: CTAB,  no rales, wheezes or rhonchi. No accessory muscle use Psych: Age appropriate judgment and insight, normal affect and mood  Assessment and Plan: Pure hypercholesterolemia - Plan: Comprehensive metabolic panel with GFR, Lipid panel  Vaginal irritation - Plan: Cervicovaginal ancillary only( [place])  Morbid obesity (HCC) - Plan: semaglutide -weight management (WEGOVY ) 1.5 MG tablet, semaglutide -weight management (WEGOVY ) 25 MG tablet, semaglutide -weight management (WEGOVY ) 9 MG tablet, semaglutide -weight management (WEGOVY ) 4 MG tablet  GAD (generalized anxiety disorder) - Plan: TSH, VITAMIN D  25 Hydroxy (Vit-D Deficiency, Fractures)  Chronic, unsure if controlled.  Check labs today.  May need to start a statin.  Discussed coronary artery calcium  scoring.  Counseled on diet and exercise. Diflucan  sent.  Will check vaginal swab to ensure she is not having lingering BV/trichomonas. Chronic, not controlled.  Start Wegovy  1.5 mg daily and titrate up.  Counseled on diet and exercise.  Stay hydrated.  Consider a fiber supplement. Chronic, not controlled.  Add the above labs.  I believe this explains most of her symptoms.  Counseled on exercise.  Counseling information provided.  Anxiety coping techniques provided.  Offered oral medication which she politely declined at this time.  She will let me know if she changes her mind. I will see her  in 6 months or as needed. The patient voiced understanding and agreement to the plan.  I spent 42 minutes with the patient discussing the above plans in addition to reviewing her chart on the same day of the visit.  Destiny [place], DO 11/01/24  8:40 AM
